# Patient Record
Sex: Male | Born: 1945 | Race: White | Hispanic: No | Marital: Married | Smoking: Light tobacco smoker
Health system: Southern US, Community
[De-identification: ages and names within clinical notes are randomized; demographics above are authoritative.]

## PROBLEM LIST (undated history)

## (undated) DIAGNOSIS — Z85820 Personal history of malignant melanoma of skin: Secondary | ICD-10-CM

## (undated) DIAGNOSIS — Z87442 Personal history of urinary calculi: Secondary | ICD-10-CM

## (undated) DIAGNOSIS — R319 Hematuria, unspecified: Secondary | ICD-10-CM

## (undated) DIAGNOSIS — I152 Hypertension secondary to endocrine disorders: Secondary | ICD-10-CM

## (undated) DIAGNOSIS — Z972 Presence of dental prosthetic device (complete) (partial): Secondary | ICD-10-CM

## (undated) DIAGNOSIS — E1122 Type 2 diabetes mellitus with diabetic chronic kidney disease: Secondary | ICD-10-CM

## (undated) DIAGNOSIS — R399 Unspecified symptoms and signs involving the genitourinary system: Secondary | ICD-10-CM

## (undated) DIAGNOSIS — M1990 Unspecified osteoarthritis, unspecified site: Secondary | ICD-10-CM

## (undated) DIAGNOSIS — J309 Allergic rhinitis, unspecified: Secondary | ICD-10-CM

## (undated) DIAGNOSIS — N329 Bladder disorder, unspecified: Secondary | ICD-10-CM

## (undated) DIAGNOSIS — N183 Chronic kidney disease, stage 3 unspecified: Secondary | ICD-10-CM

## (undated) DIAGNOSIS — Z8669 Personal history of other diseases of the nervous system and sense organs: Secondary | ICD-10-CM

## (undated) DIAGNOSIS — E785 Hyperlipidemia, unspecified: Secondary | ICD-10-CM

## (undated) DIAGNOSIS — Z9989 Dependence on other enabling machines and devices: Secondary | ICD-10-CM

## (undated) DIAGNOSIS — N210 Calculus in bladder: Secondary | ICD-10-CM

## (undated) DIAGNOSIS — N400 Enlarged prostate without lower urinary tract symptoms: Secondary | ICD-10-CM

## (undated) DIAGNOSIS — E119 Type 2 diabetes mellitus without complications: Secondary | ICD-10-CM

## (undated) DIAGNOSIS — G4733 Obstructive sleep apnea (adult) (pediatric): Secondary | ICD-10-CM

## (undated) HISTORY — PX: TOTAL KNEE ARTHROPLASTY: SHX125

## (undated) HISTORY — PX: MELANOMA EXCISION: SHX5266

---

## 1970-03-03 HISTORY — PX: GANGLION CYST EXCISION: SHX1691

## 2004-11-20 ENCOUNTER — Emergency Department (HOSPITAL_COMMUNITY): Admission: EM | Admit: 2004-11-20 | Discharge: 2004-11-20 | Payer: Self-pay | Admitting: Emergency Medicine

## 2005-12-15 ENCOUNTER — Inpatient Hospital Stay (HOSPITAL_COMMUNITY): Admission: RE | Admit: 2005-12-15 | Discharge: 2005-12-18 | Payer: Self-pay | Admitting: Orthopedic Surgery

## 2006-12-14 ENCOUNTER — Inpatient Hospital Stay (HOSPITAL_COMMUNITY): Admission: RE | Admit: 2006-12-14 | Discharge: 2006-12-16 | Payer: Self-pay | Admitting: Orthopedic Surgery

## 2007-10-27 IMAGING — CR DG CHEST 2V
2 series · 2 of 2 positions shown · non-contrast
Comparison: None

CLINICAL DATA: 60-year-old male, history of sleep apnea, pre-op evaluation for left knee surgery.     
 CHEST - 2 VIEW:

[view not recorded (1 of 2)]
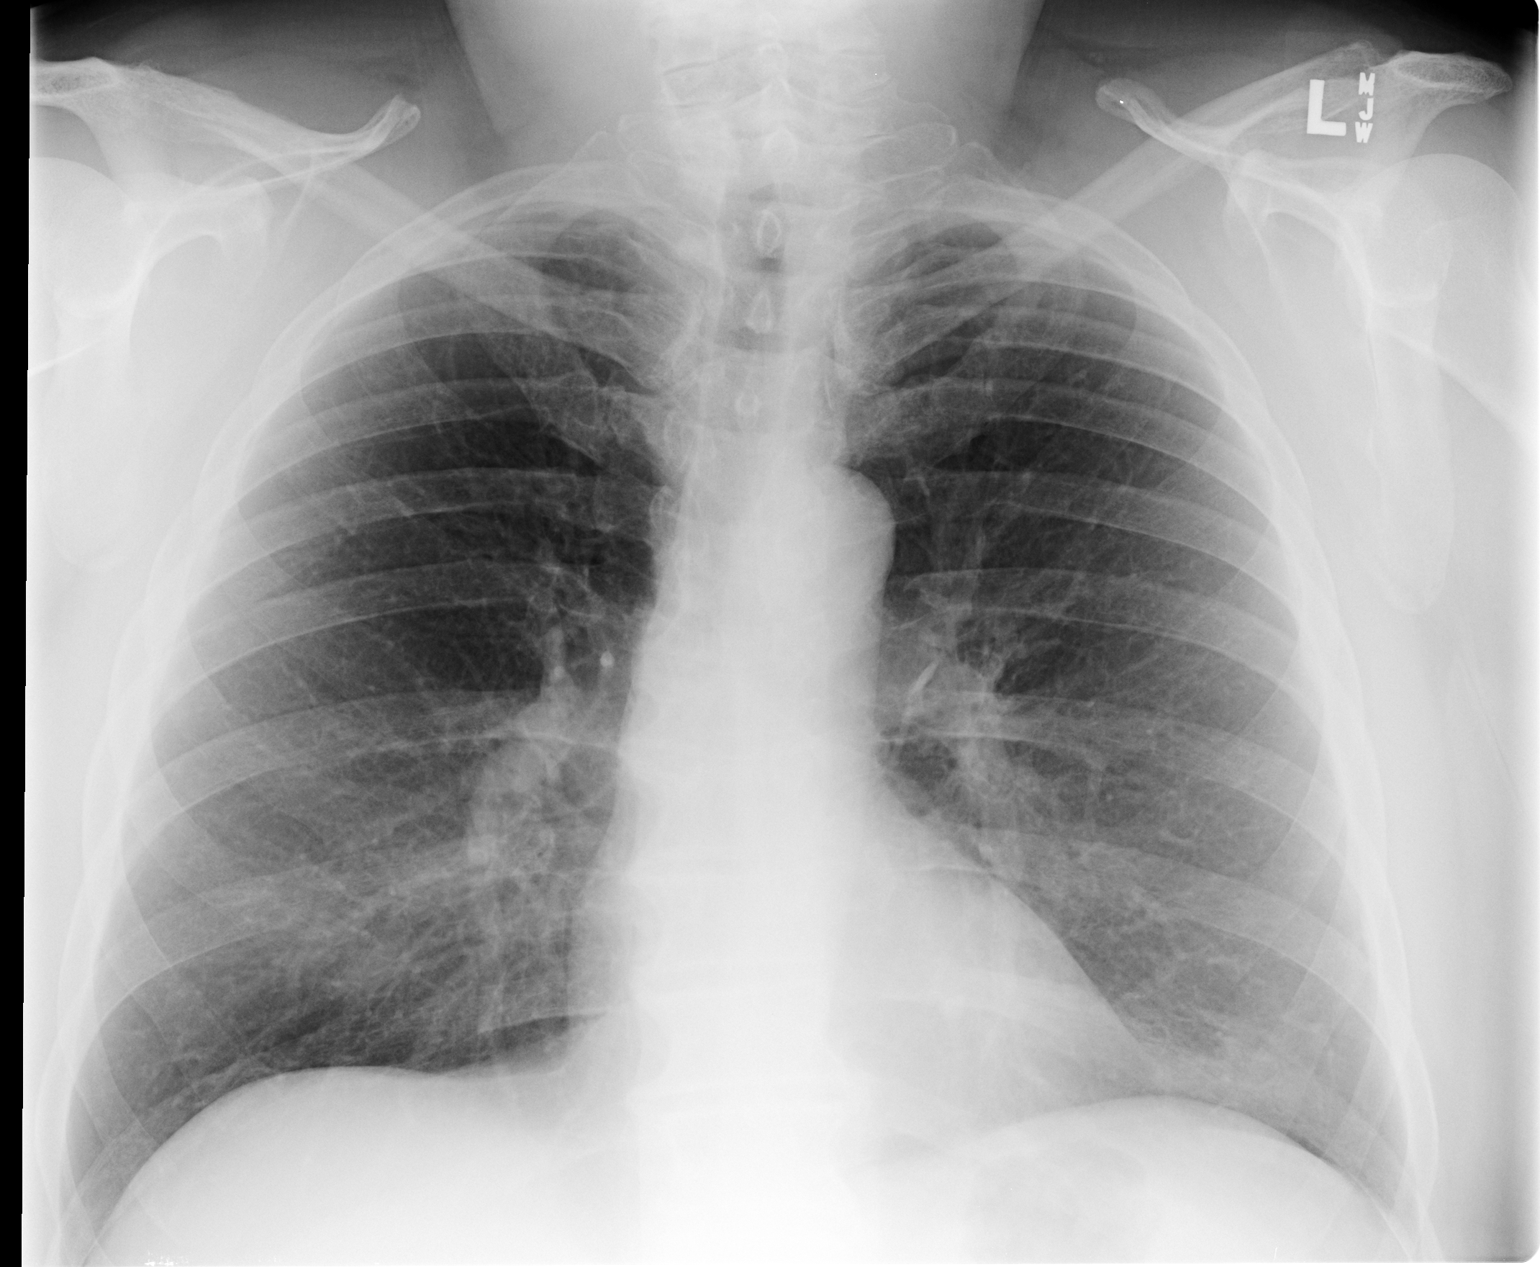

[view not recorded (2 of 2)]
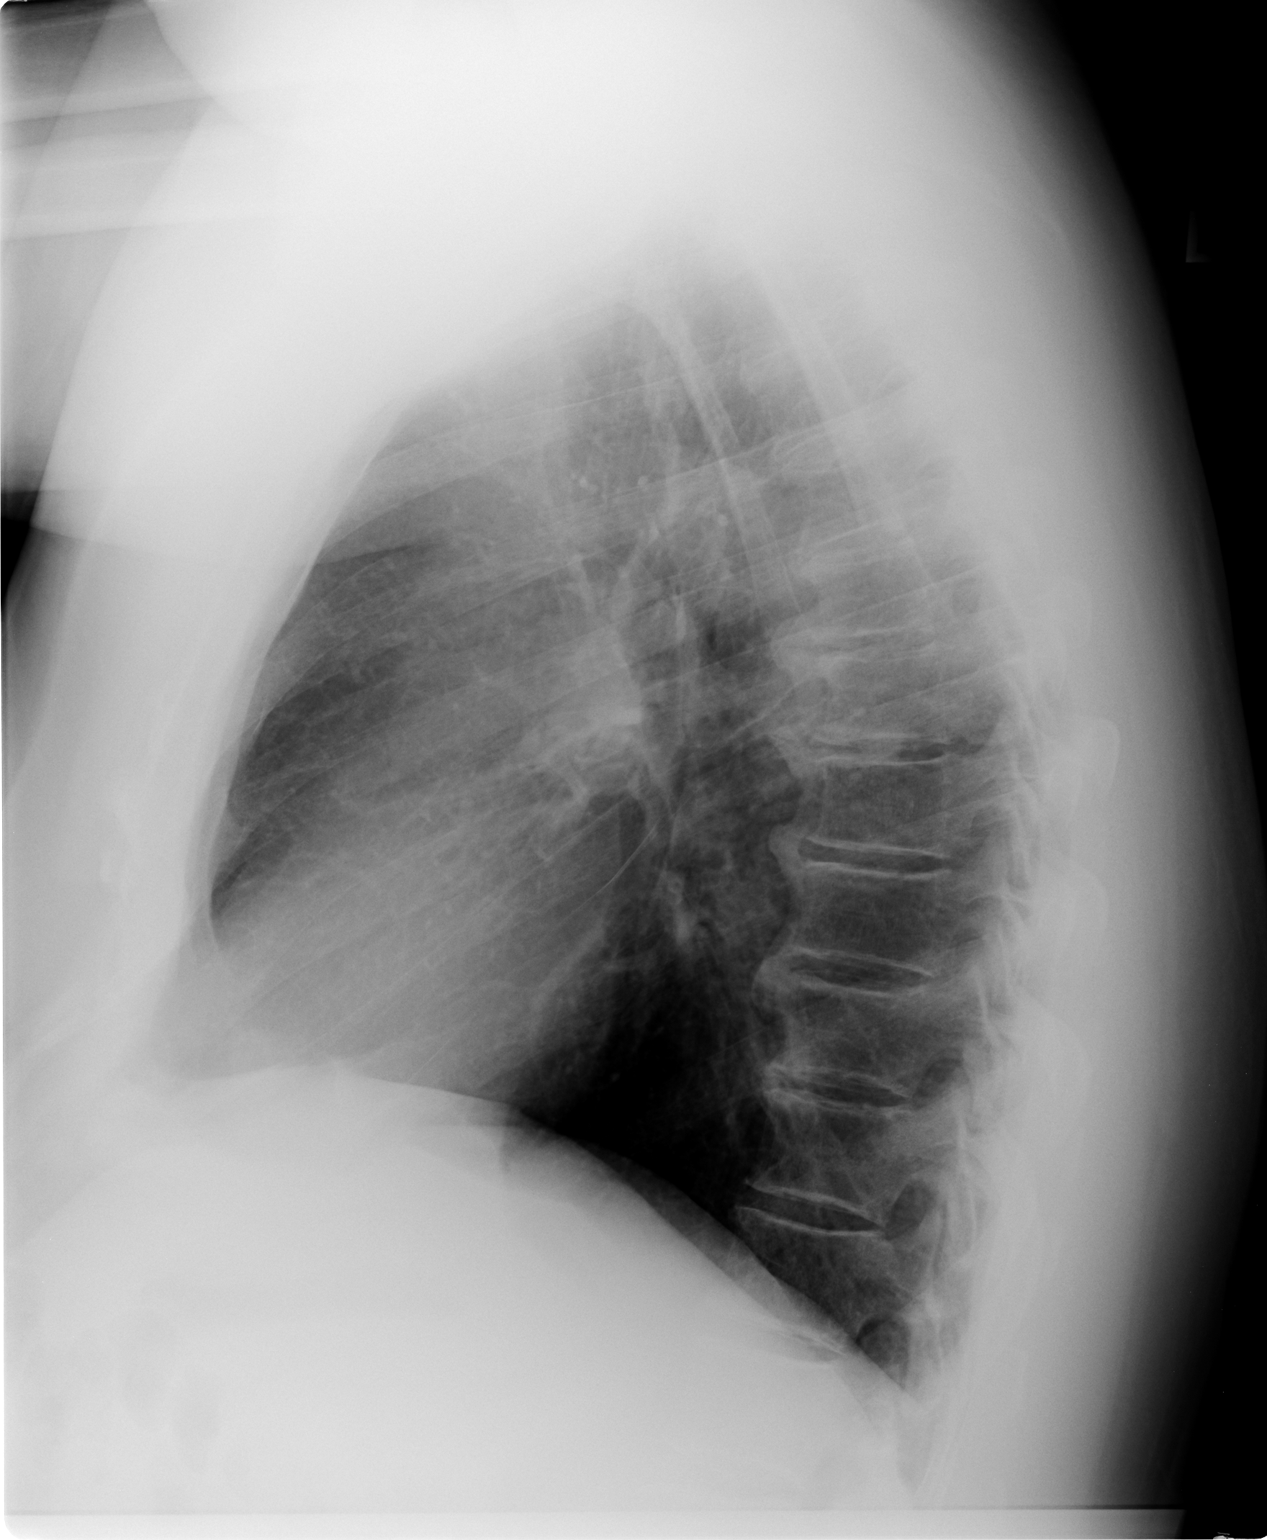

[2 of 2 positions shown; findings below may reference images not displayed]

FINDINGS: Minimal central airway thickening and interstitial prominence without acute pneumonia, edema, effusion, or pneumothorax.  Normal heart size.  Degenerative change of the spine.
IMPRESSION: Minimal central airway thickening and interstitial prominence.  No acute chest process.

## 2008-10-25 IMAGING — CR DG CHEST 2V
2 series · 2 of 2 positions shown · non-contrast
Comparison: none

CLINICAL DATA: Preoperative respiratory exam for knee replacement.  Hypertension.
 CHEST - 2 VIEWS:

[view not recorded (1 of 2)]
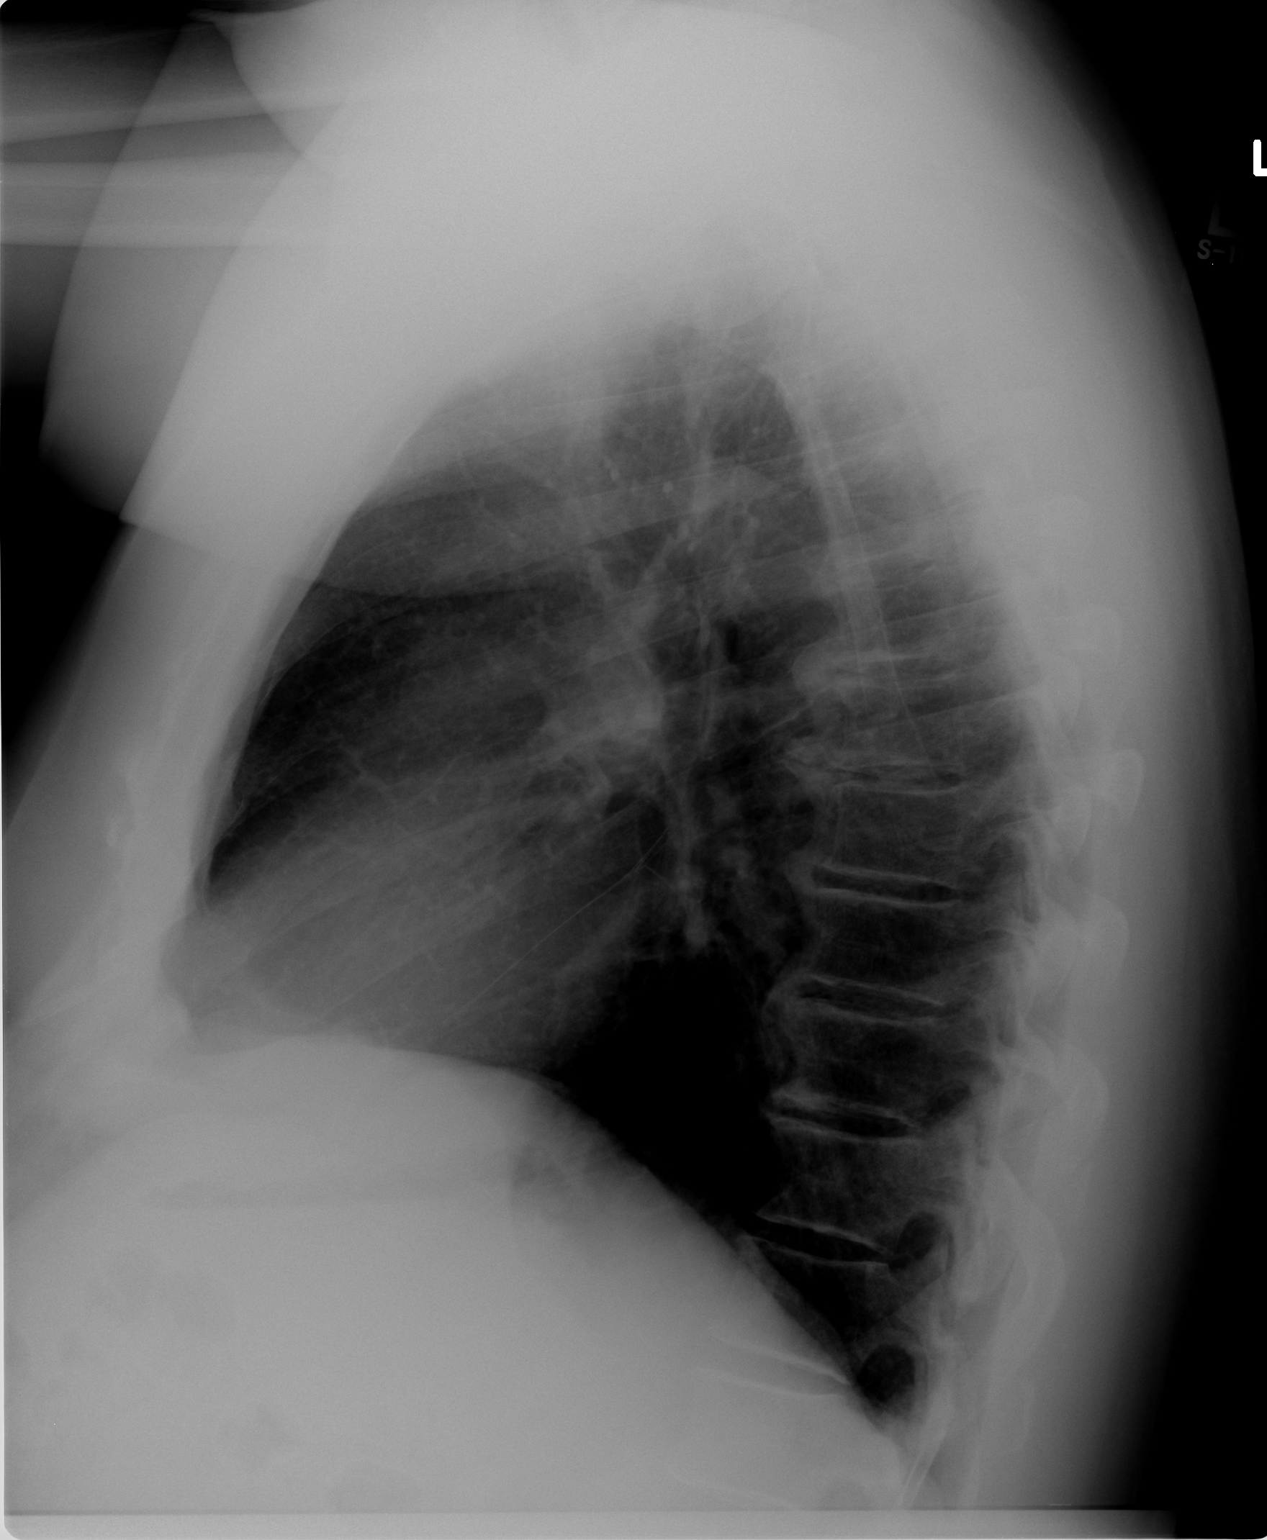

[view not recorded (2 of 2)]
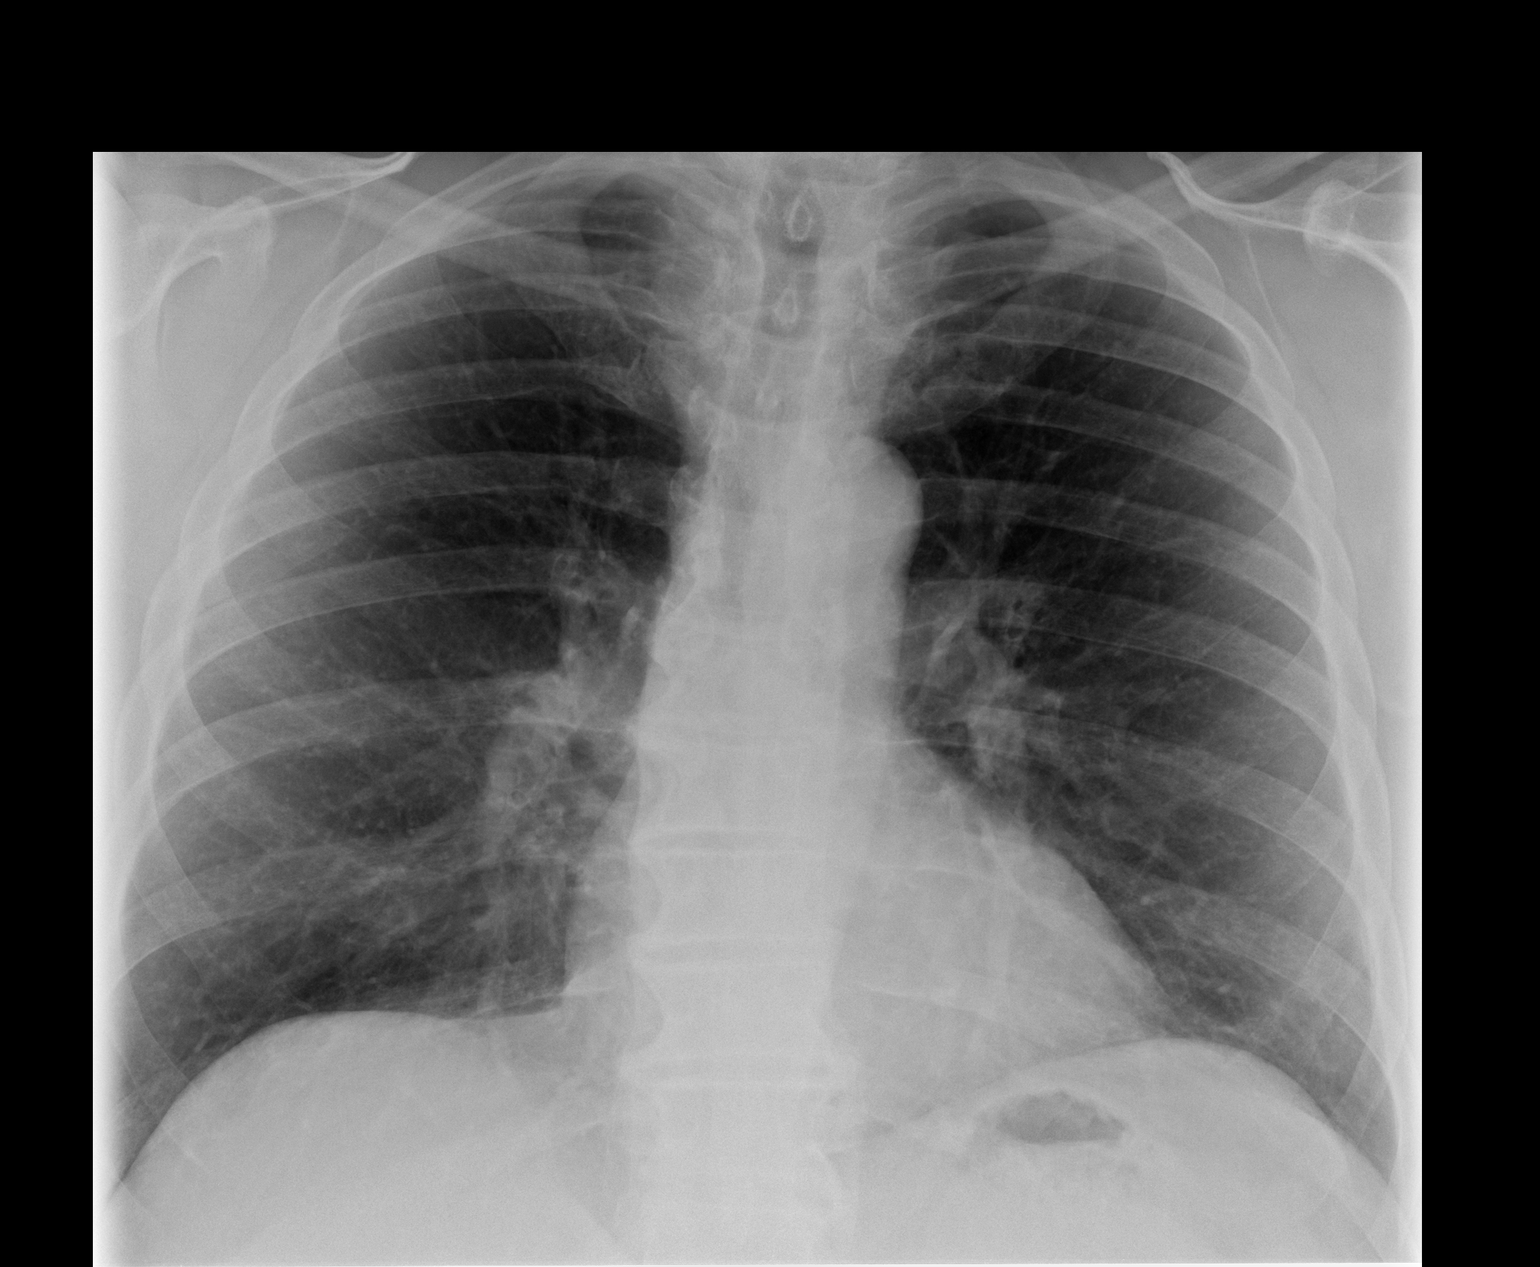

[2 of 2 positions shown; findings below may reference images not displayed]

FINDINGS: The heart size and mediastinal contours are within normal limits.  Both lungs are clear.  The visualized skeletal structures are within normal limits.
IMPRESSION: No active cardiopulmonary disease.

## 2010-07-16 NOTE — Op Note (Signed)
NAMETAGG, EUSTICE NO.:  [id number]   MEDICAL RECORD NO.:  [id number]          PATIENT TYPE:  INP   LOCATION:  2899                         FACILITY:  [facility]   PHYSICIAN:  Feliberto Gottron. Turner Daniels, M.D.   DATE OF BIRTH:  [birth year]   DATE OF PROCEDURE:  12/14/2006  DATE OF DISCHARGE:                               OPERATIVE REPORT   PREOPERATIVE DIAGNOSIS:  Osteoarthritis of right knee.   POSTOPERATIVE DIAGNOSIS:  Osteoarthritis of right knee.   PROCEDURE:  Right total knee arthroplasty using DePuy Sigma RP  components, #5 femur, #5 tibia, 10-mm Sigma RP bearing, 41 mm patellar  button.  All components cemented DePuy I cement with 1500 mg Zinacef.   SURGEON:  Feliberto Gottron. Turner Daniels, M.D.   FIRST ASSISTANT:  Erskine Squibb B. Su Hilt, P.A.-C.   ANESTHESIA:  General endotracheal anesthesia.   ESTIMATED BLOOD LOSS:  Minimal.   FLUID REPLACEMENT:  59 mL crystalloid.   DRAINS PLACED:  Two medium hemostats.   TOURNIQUET TIME:  One hour and 20 minutes.   INDICATIONS FOR PROCEDURE:  A 65 year old man who underwent a successful  left total knee a few months ago and now desires same on the right, end-  stage arthritis with a 10 degrees flexion contracture, 7 degrees of  varus deformity, bone-on-bone arthritic changes by x-rays and he has  failed conservative treatment.  Risks and benefits of surgery are well-  known to the patient and questions have been answered.   DESCRIPTION OF PROCEDURE:  The patient was identified by armband and  underwent femoral nerve block anesthesia in the block area at [facility] with anesthetic monitors attached.  He was then  taken to operating room 15 where the appropriate anesthetic monitors  were reattached.  He received 2 grams of Ancef preoperatively and  general endotracheal anesthesia was induced.  A lateral post and foot  positioner were applied to the right side of the table.  Tourniquet  applied high to the right thigh and the  right lower extremity prepped  and draped in usual sterile fashion from the ankle to the tourniquet.  We began the procedure by wrapping the limb with an Esmarch bandage and  inflating the tourniquet to 350 mmHg and an anterior midline incision 20  cm in length was made from a handbreadth above the patella, over the  patella just medial 2 and 3 cm distal to the tibial tubercle.  Small  bleeders in the skin and subcutaneous tissue were identified and  cauterized.  The transverse retinaculum over the patella was incised and  reflected medially allowing a medial parapatellar arthrotomy, leaving a  small cuff of tendon on the medial side of the arthrotomy.  The patella  was everted, prepatellar fat pad was resected, the superficial medial  collateral ligament was elevated proximally on the tibia from anterior  to posterior leaving intact distally and stripping it off the flare of  the metaphysis.  This loosened up the medial side which was quite  contracted.  At this point, the patella was everted and the knee  hyperflexed.  Using osteotomes, peripheral notch osteophytes were  removed allowing resection of the ACL and PCL.  The superficial medial  collateral ligament was elevated all the way around to the posteromedial  corner.  We did not have to take down the semimembranosus.  The tibia  was then externally rotated with the knee hyperflexed and a  posteromedial Z retractor was placed.  A McHale retractor through the  notch and a lateral Homan retractor.  This exposed the proximal tibia  which was then entered in the center with the DePuy step drill, followed  by intramedullary rod, followed by a 0 degrees posterior slope cutting  guide.  This allowed removal of about 3-4 mm of bone medially, 8-9 mm of  bone laterally, with the cutting guide pinned into place using the power  saw.  We then entered the distal femur with the step drill 2 mm anterior  to the PCL origin, followed by a 5 degree  right distal femoral cutting  guide rod set at 12 mm and then pinned in place along the epicondylar  axis.  The distal femoral cut was accomplished.  We measured for a #5  right femoral component and the pins were drilled in 3 degrees of  external rotation.  The chamfer cutting guide was then brought up and  placed.  We performed the anterior, posterior and chamfer cuts without  difficulty, removed some posterior osteophytes from the superior femur  and then performed our box cut as well with a box cutting guide.  The  patella was then measured a 26 mm, the cutting guide set at 15 and the  posterior 11 mm of the patella resected, sized for a 41 button and  drilled.  The knee was once again hyperflexed.  We sized the tibia to a  #5 tibial baseplate which was pinned into place, smokestack was hammered  into place, the conical reamer applied, followed by the Deltafit keel  punch and then a 5 right trial femoral component was hammered onto the  femur and the lugs drilled.  At this point we selected a #5 10-mm sigma  RP bearing which was slipped into place.  The knee came to full  extension and flexed to 130 degrees.  The patellar trial tracked  normally and our sizes were now set.  At this point the trial components  were removed.  All bony surfaces were waterpiked clean and dried with  suction and sponges.  A double patch of DePuy 1 cement with 1500 mg  Zinacef was mixed at the back table and applied to all bony and metallic  mating surfaces except for the posterior condyles of the femur itself.  At this point, we hammered into place a #5 tibial baseplate and removed  excess cement.  The #5 right femoral component, removed excess cement  and clamped a 41 mm patellar button into place and removed excess cement  with Personal assistant.  The 10-mm sigma RP bearing was then snapped into  place, the knee reduced, brought to full extension and held in 5 degrees  of flexion with compression as the  cement hardened.  The wound was then  irrigated out with normal saline solution one more time.  Medium Hemovac  drains were placed deep in the wound as the cement hardened.  Once it  had hardened, we checked our motion one more time and ligament stability  was found to be excellent.  At this point, the parapatellar arthrotomy  was closed  with running #1 Vicryl suture.  The subcutaneous tissue 0 and  2-0 undyed Vicryl suture and the skin with skin staples.  A dressing of  Xeroform, 4x4 dressing sponges, Webril and Ace wrap was then applied.  The tourniquet was let down.  The patient was then awakened and taken to  the recovery room without difficulty.      Feliberto Gottron. Turner Daniels, M.D.  Electronically Signed     FJR/MEDQ  D:  12/14/2006  T:  12/14/2006  Job:  [id number]

## 2010-07-19 NOTE — Discharge Summary (Signed)
NAME:  Kenneth Chen, Kenneth Chen NO.:  [id number]   MEDICAL RECORD NO.:  [id number]          PATIENT TYPE:  INP   LOCATION:  5018                         FACILITY:  [facility]   PHYSICIAN:  Feliberto Gottron. Turner Daniels, M.D.   DATE OF BIRTH:  1946/03/01   DATE OF ADMISSION:  12/15/2005  DATE OF DISCHARGE:  12/18/2005                                 DISCHARGE SUMMARY   PRIMARY DIAGNOSIS FOR THIS ADMISSION:  End-stage degenerative joint disease  of the left knee.   PROCEDURE WHILE IN HOSPITAL:  Left total knee arthroplasty.   HISTORY OF PRESENT ILLNESS:  The patient is a 65 year old male with end-  stage arthritis of the left knee.  He has failed conservative treatment,  including physical therapy, __________injection, steroid injections,  nonsteroidal antiinflammatories, rest and physical therapy.  The pain now  interferes with sleep, walking and other activities of daily living.  He  wishes to proceed with a left total knee arthroplasty after discussing the  risks versus benefits of the procedure.   ALLERGIES:  NO KNOWN DRUG ALLERGIES.   MEDICATIONS AT TIME TO ADMISSION:  Were Allegra, Rhinocort and aspirin.   PAST MEDICAL HISTORY:  Usual childhood diseases.  Adult history:  1. DJD.  2. Kidney stones.  3. Sleep apnea.   SURGICAL HISTORY:  Ganglion cyst removal in 1972.   SOCIAL HISTORY:  Positive for tobacco occasionally.  No ethanol.  No IV drug  abuse.  He is a life Nutritional therapist.  He is married and lives at home  with wife.   FAMILY HISTORY:  Mother is deceased with history of cancer and diabetes.  Father is deceased with a history of kidney disease and hypertension.   REVIEW OF SYSTEMS:  Positive for glasses wearer, hemorrhoids, difficulty  sleeping and upper dentures.  He denies shortness of breath, chest pain or  recent illness.   PHYSICAL EXAMINATION:  VITAL SIGNS:  Patient's temperature is 99.1.  Pulse  90.  Blood pressure 137/98.  He is 5 feet 11 inches, 145 pound  male.  HEENT:  Head is normocephalic, atraumatic.  Ears:  TMs are clear.  Eyes:  Pupils equal, round and reactive to light and accommodation.  Nose:  Patent.  Throat:  Benign.  NECK:  Supple.  Full range of motion.  CHEST:  Clear to auscultation and percussion.  HEART:  Regular rate and rhythm.  ABDOMEN:  Soft, nontender.  Bowel sounds 2+.  EXTREMITIES:  Range of motion left knee 5 to 100 degrees, positive pain and  crepitus with 5 to 10 degree of various deformity.  SKIN:  Within normal limits.   X-ray shows bone on bone change in medial compartment of both knees with  positive patella femoral changes as well.   LABS:  Preoperative labs including CBC, CMET, chest x-ray, EKG, PT and PTT  were all within normal limits with the exception of the EKG, which just  showed nonspecific T-wave abnormality, as well as a hemoglobin of 17.3 and a  glucose of 132.   HOSPITAL COURSE:  On the day of admission, the patient  was taken to the  operating room at [facility] where he underwent a left total knee  arthroplasty using DePew sigma components with a number 5 femur, number 5  tibia, 10 spacer with a 41 mm patella, all components cemented.  Medium  Hemovac ___________was placed in the drain.  Foley catheter was placed  perioperatively.  The patient was placed on preoperative antibiotics.  He  was placed on postoperative Coumadin prophylaxis per pharmacy protocol with  a target INR of 1.52 with Lovenox coverage.  He was placed on a PCA Dilaudid  pain pump for pain control.  He was placed on a CPAP protocol by respiratory  therapy for his sleep apnea postoperatively.  Postop physical therapy was  begun in the recovery room with CPM machine.   Postoperative day 1, the patient was awake, alert and in moderate pain.  No  nausea or vomiting and bowel sounds are stable.  Dressing was dry.  Hemoglobin 12.9, WBC of 8.9, INR of 1.1.  T-Max 100.0.  Urine output 1000;  otherwise, neurovascularly intact.   Foley was discontinued.  He continued  with physical therapy weight bearing as tolerated, as well as his Coumadin  treatment.   Postoperative day 2, the patient was without complaints.  T-Max 101.8,  ranging to 98.6.  INR 1.5.  WBC of 10.5, 12.2 hemoglobin.  Dressing was dry.  Calf was soft.  He continued with physical therapy and thus making steady  progress and had already undergone discharge planning to facilitate his  discharge as he progressed.   Postoperative day 3, the patient was awake and alert in decreased pain.  Vital signs were stable.  T-Max 101 ranging to afebrile at the time of  discharge.  WBC of 8.8, hemoglobin 12.  INR greater than 1.6.  Wound was  clean and dry.  No effusion.  CPM at 55 degrees.  Wound was benign.  Calf  was soft.  He was medically stable and orthopedically improved.  He was  discharged home to the care of his family.  He will have home health RN for  Coumadin draws through [facility] for 2 weeks postoperatively.  Return to clinic to see Dr. Turner Daniels in approximately one week's time, sooner  if he is having difficulties with increased temperature, increased pain or  drainage from the wound.   DISCHARGE MEDICATIONS:  Include Allegra and Rhinocort started as per his  home medication reconciliation sheet.  He is given prescriptions for  Coumadin and Percocet.   Dressing changes daily or as needed if the dressings become wet.  Okay to  shower.   ACTIVITY:  Weight bearing as tolerated with crutches or walker.  Continue  with CPM.   DIET:  Regular.      Laural Benes. Jannet Mantis.      Feliberto Gottron. Turner Daniels, M.D.  Electronically Signed    JBR/MEDQ  D:  01/07/2006  T:  01/08/2006  Job:  [id number]

## 2010-07-19 NOTE — Op Note (Signed)
NAME:  Kenneth Chen, Kenneth Chen NO.:  [id number]   MEDICAL RECORD NO.:  [id number]          PATIENT TYPE:  INP   LOCATION:  NA                           FACILITY:  [facility]   PHYSICIAN:  Feliberto Gottron. Turner Daniels, M.D.   DATE OF BIRTH:  05-04-1945   DATE OF PROCEDURE:  12/15/2005  DATE OF DISCHARGE:                                 OPERATIVE REPORT   PREOPERATIVE DIAGNOSIS:  End-stage arthritis, left knee.   POSTOPERATIVE DIAGNOSIS:  End-stage arthritis, left knee.   PROCEDURE:  Left total knee arthroplasty using DePuy Sigma RP components, 5  femur, 5 tibia, 41 mm patella, 10 Sigma RP bearing, double batch of DePuy  regular set cement with 1500 mg of Zinacef in the cement.   SURGEON:  Feliberto Gottron. Turner Daniels, M.D.   FIRST ASSISTANT:  Skip Mayer PA-C.   ANESTHETIC:  General endotracheal.   ESTIMATED BLOOD LOSS:  Minimal.   FLUID REPLACEMENT:  1200 mL crystalloid.   DRAINS PLACED:  Two medium Hemovacs and a Foley catheter.   URINE OUTPUT:  300 mL.   TOURNIQUET TIME:  1 hour and 25 minutes.   INDICATIONS FOR PROCEDURE:  A 65 year old gentleman with end-stage arthritis  of both knees has failed conservative treatment with anti-inflammatory  medicines, physical therapy, exercise, visco-  supplementation, and  judicious use of narcotics.  He has bone-on-bone arthritic changes to the  medial compartments of both knees and desires elective left total knee  arthroplasty.  Risks and benefits of surgery discussed and questions  answered.   DESCRIPTION OF PROCEDURE:  The patient identified by armband, taken to the  operating room at [facility].  Appropriate anesthetic monitors were  attached and general endotracheal anesthesia induced with the patient in  supine position.  Foley catheter was inserted.  He received 1 g of Ancef  preoperatively.  Lateral post and foot positioner applied to the table and a  tourniquet applied high to the left thigh.  Left lower extremity prepped and  draped in usual sterile fashion from the ankle to the tourniquet.  The limb  was wrapped with an Esmarch bandage, tourniquet inflated to 350 mmHg and  began the procedure by making anterior midline incision about 18 cm in  length, centered over the patella, starting 1 handbreadth above the patella  and extending over the patella down just distal to and medial to the tibial  tubercle, through the skin and subcutaneous tissue down to the level of the  transverse retinaculum which was incised and reflected medially allowing a  medial parapatellar arthrotomy.  The patella was then everted, the  prepatellar fat pad resected.  The superficial medial collateral ligament  was elevated off the proximal tibia leaving it intact distally and going all  the way around the semimembranosus origin.  The patella was everted; the  knee was flexed to about 130 degrees.  Osteophytes were removed from around  the femur/at the notch area, and then a posterior medial Z retractor was  placed followed by a McHale retractor through the notch and a lateral Homan  retractor.  The tibial spines were then removed with an osteotome and the  proximal tibia entered with the DePuy step-drill followed by the  intramedullary rod and a 0 degree posterior slope cutting guide.  We then  performed our proximal tibial resection after pinning the cutting guide in  place, allowing removal of about 7-8 mm of bone medially and 10 mm of bone  laterally.  Satisfied with our tibial cut, we then directed our attention to  the distal femur which was entered with the step-drill followed by the  intramedullary rod and a 5-degree left distal femoral cutting guide.  This  was set at 12 mm.  It was a relatively large size of his femur, and the  cutting guide was pinned into place along the epicondylar axis and the  distal femoral cut accomplished.  We sized for a 5 femoral component, pinned  at 3 degrees of external rotation on the femur and  performed our anterior,  posterior, and chamfer cuts without difficulty followed by the Sigma RP box  cut.  The everted patella was measured at 25 mm.  Because it was large, we  anticipated a 41 mm patellar button.  We set the cutting guide for 15 and  resected the posterior 10 mm of the patella.  It was sized for a 41 button  and drilled, and some more osteophytes were removed.  The knee was then once  again hyperflexed, exposing the proximal tibia which was sized to a #5  baseplate which was pinned into place, the smokestack applied, and the  conical reaming accomplished.  The Delta fit keel was then hammered into  place with the centering bullet, and a trial reduction was performed with a  5 left trial femur, a 10-mm Sigma RP spacer on top of the tibial baseplate  and a 41 mm patellar button.  Excellent tracking was noted with good  stability.  The trial components were removed.  All bony surfaces were water-  picked clean and dried with suction and sponges and at the back table, the  components were then opened, and a double batch of DePuy standard viscosity  cement was then mixed with 1500 mg of Zinacef.  It was applied to all bony  and metallic mating surfaces except for the posterior condyles of the femur  itself.  In order, we then hammered into place a #5 tibial baseplate and  removed excess cement; a 5 left femoral component removed, excess cement.  We snapped in a 10-mm sigma RP bearing, reduced the knee, compressed the 41  mm patellar button, and removed excess cement and placed 2 medium Hemovacs  into the wound from a superolateral approach.  After the cement had cured,  the knee was irrigated out with normal saline solution.  We checked our  tracking 1 more time, and the knee came to full extension and flexed easily  to 130 degrees.  At this point, we closed with running #1 Vicryl suture in  the parapatellar arthrotomy, undyed 0 and 2-0 Vicryl suture in the subcutaneous tissue  and skin staples, a dressing of Xeroform 4x4 dressing  sponges, Webril, and Ace wrap was applied.  The tourniquet was let down.  The patient was then awakened and taken to the recovery room without  difficulty.      Feliberto Gottron. Turner Daniels, M.D.  Electronically Signed     FJR/MEDQ  D:  12/15/2005  T:  12/15/2005  Job:  [id number]

## 2010-07-19 NOTE — Discharge Summary (Signed)
NAME:  Kenneth, Chen NO.:  [id number]   MEDICAL RECORD NO.:  [id number]          PATIENT TYPE:  INP   LOCATION:  5013                         FACILITY:  [facility]   PHYSICIAN:  Feliberto Gottron. Turner Daniels, M.D.   DATE OF BIRTH:  March 22, 1945   DATE OF ADMISSION:  12/14/2006  DATE OF DISCHARGE:  12/16/2006                               DISCHARGE SUMMARY   DIAGNOSIS:  End-stage degenerative joint disease of the right knee.   PROCEDURE WHILE IN THE HOSPITAL:  Right total knee arthroplasty.   DISCHARGE SUMMARY:  The patient is a 65 year old man who underwent left  total knee arthroplasty a few months prior to this admission.  He now  desires the same for the right where he has known end-stage arthritis  with a 10-degree flexure contraction, 7-degrees of varus deformity bone  on bone arthritic changes by x-ray.  He has failed conservative  treatment.  Risks and benefits of surgery well known to the patient.  Questions have been answered, he now wishes to proceed with the  procedure.   THE PATIENT HAS NO KNOWN DRUG ALLERGIES.   MEDICATIONS AT THE TIME OF ADMISSION:  Allegra and aspirin.   PAST MEDICAL HISTORY:  Usual childhood diseases.  Adult history:  1. DJD.  2. Kidney stones.  3. Sleep apnea.   PAST SURGICAL HISTORY:  1. Ganglion cyst.  2. Left total knee arthroplasty.   SOCIAL HISTORY:  Occasional tobacco.  No ethanol.  No IV drug abuse.  He  is a life Nutritional therapist.  He is married.   FAMILY HISTORY:  Mother died with history of cancer and diabetes.  Father died with history of kidney disease and hypertension.   REVIEW OF SYSTEMS:  Positive for glasses, hemorrhoids, difficulty  sleeping, upper dentures.  He denies any shortness of breath, chest  pain, or recent illness.   EXAMINATION:  VITAL SIGNS:  Temperature 98.2, pulse 87, blood pressure  148/96.  He is a 5 foot 11 inch, 245 pound male.  HEAD:  Normocephalic, atraumatic.  EARS:  TMs clear.  EYES:  Pupils  equally round and reactive to light and accommodation.  THROAT:  Benign.  NECK:  Supple.  Full range of motion.  CHEST:  Clear to auscultation and percussion.  HEART:  Regular rate and rhythm.  ABDOMEN:  Soft, nontender.  Bowel sounds 2+.  No masses.  EXTREMITIES:  Range of motion of the left total knee is 0 to 120 degrees  and stable.  Range of motion of the right knee shows range of motion 5  to 110 degrees with positive crepitus and pain, 5 degree varus  deformity, stable ligamentously.  SKIN:  Well-healed normal scar on the left knee.  No other breaks in  skin noted.   X-ray showed bone on bone changes in the medial compartment of the right  knee.   Preoperative labs including CBC, C-Met, chest x-ray, EKG, PT, and PTT  were all within normal limits with exception of a glucose of 165.   HOSPITAL COURSE:  On the day of admission, the patient was  taken to the  operating room at [facility] where he underwent a right total knee  arthroplasty using DePuy Sigma RP components, #5 femur, #5 tibia, 10-mm  Sigma RP bearing, 41-mm patellar button.  All components cemented with  DePuy 1 cement with 1500 mg of Ancef embedded.  A medium HemoVac was  placed in the knee double-armed.  The patient was placed on  perioperative antibiotics.  He was placed on postoperative Coumadin  prophylaxis with target INR of 1.5 to 2 with bridging Lovenox therapy  until he became therapeutic.  He was placed on a PCA Dilaudid pump for  pain control.  Physical therapy was begun in the PACU using a CPM.   Postoperative day 1, the patient was awake and alert.  He reports some  nausea and vomiting the night before, none since.  T-max 100.  INR 1.0.  Hemoglobin 13.6, WBC 10.6.  Dressing was dry.  HemoVac was discontinued.  PCA was discontinued.  Physical therapy was begun, weightbearing as  tolerated.   Postoperative day 2, the patient reported notable improvement.  He has  walked in the hallway, taken p.o.'s  well, had a bowel movement.  The  wound was clean and dry.  T-max 100.4 ranging 99.  Hemoglobin 12.1, WBC  11.  INR 1.7.  He was neurovascularly intact.  He was both medically  stable and orthopedically improved.  He was discharged home to the care  of his family as he mends.  PT goals of independent ambulation with  safety precautions.   Return to the clinic in approximately 1 week time for follow up check.   Prescriptions for Percocet and Coumadin were given.  He will be followed  by home health for the Coumadin, as well as home health PT and RN as  necessary, with advanced home care.   Dressing changes every day or every other day as needed.  May shower  postoperative day 5, seated.  Diet is regular.  Resume home meds as per  home medication reconciliation sheet.  Activity is weightbearing as  tolerated with walker.  Home health CPM as well.      Laural Benes. Jannet Mantis.      Feliberto Gottron. Turner Daniels, M.D.  Electronically Signed   JBR/MEDQ  D:  01/26/2007  T:  01/26/2007  Job:  [id number]

## 2010-09-02 ENCOUNTER — Encounter (INDEPENDENT_AMBULATORY_CARE_PROVIDER_SITE_OTHER): Payer: Self-pay | Admitting: General Surgery

## 2010-12-11 LAB — CBC
HCT: 35.1 — ABNORMAL LOW
Hemoglobin: 12.1 — ABNORMAL LOW
MCHC: 34.5
RBC: 3.99 — ABNORMAL LOW

## 2010-12-11 LAB — PROTIME-INR: INR: 1.7 — ABNORMAL HIGH

## 2010-12-12 LAB — CBC
HCT: 39.4
HCT: 47.9
Hemoglobin: 13.6
Hemoglobin: 16.3
MCHC: 34.5
RDW: 13.7
RDW: 13.7

## 2010-12-12 LAB — PROTIME-INR
INR: 1
Prothrombin Time: 13

## 2010-12-12 LAB — DIFFERENTIAL
Basophils Absolute: 0
Basophils Relative: 0
Neutro Abs: 4.3
Neutrophils Relative %: 56

## 2010-12-12 LAB — COMPREHENSIVE METABOLIC PANEL
Alkaline Phosphatase: 81
BUN: 20
Chloride: 106
Glucose, Bld: 165 — ABNORMAL HIGH
Potassium: 4.4
Total Bilirubin: 0.9
Total Protein: 6.9

## 2010-12-12 LAB — URINALYSIS, ROUTINE W REFLEX MICROSCOPIC
Bilirubin Urine: NEGATIVE
Glucose, UA: NEGATIVE
Ketones, ur: NEGATIVE
pH: 7

## 2010-12-12 LAB — APTT: aPTT: 31

## 2011-11-30 DIAGNOSIS — Z23 Encounter for immunization: Secondary | ICD-10-CM | POA: Diagnosis not present

## 2011-12-17 DIAGNOSIS — D239 Other benign neoplasm of skin, unspecified: Secondary | ICD-10-CM | POA: Diagnosis not present

## 2011-12-17 DIAGNOSIS — L821 Other seborrheic keratosis: Secondary | ICD-10-CM | POA: Diagnosis not present

## 2012-03-15 DIAGNOSIS — Z125 Encounter for screening for malignant neoplasm of prostate: Secondary | ICD-10-CM | POA: Diagnosis not present

## 2012-03-15 DIAGNOSIS — Z0000 Encounter for general adult medical examination without abnormal findings: Secondary | ICD-10-CM | POA: Diagnosis not present

## 2012-03-15 DIAGNOSIS — R030 Elevated blood-pressure reading, without diagnosis of hypertension: Secondary | ICD-10-CM | POA: Diagnosis not present

## 2012-03-15 DIAGNOSIS — IMO0001 Reserved for inherently not codable concepts without codable children: Secondary | ICD-10-CM | POA: Diagnosis not present

## 2012-04-01 DIAGNOSIS — Z1211 Encounter for screening for malignant neoplasm of colon: Secondary | ICD-10-CM | POA: Diagnosis not present

## 2012-04-21 DIAGNOSIS — G4733 Obstructive sleep apnea (adult) (pediatric): Secondary | ICD-10-CM | POA: Diagnosis not present

## 2012-11-29 DIAGNOSIS — Z23 Encounter for immunization: Secondary | ICD-10-CM | POA: Diagnosis not present

## 2013-01-03 DIAGNOSIS — Z808 Family history of malignant neoplasm of other organs or systems: Secondary | ICD-10-CM | POA: Diagnosis not present

## 2013-01-03 DIAGNOSIS — D239 Other benign neoplasm of skin, unspecified: Secondary | ICD-10-CM | POA: Diagnosis not present

## 2013-01-03 DIAGNOSIS — L821 Other seborrheic keratosis: Secondary | ICD-10-CM | POA: Diagnosis not present

## 2013-09-08 DIAGNOSIS — Z23 Encounter for immunization: Secondary | ICD-10-CM | POA: Diagnosis not present

## 2013-09-08 DIAGNOSIS — Z0000 Encounter for general adult medical examination without abnormal findings: Secondary | ICD-10-CM | POA: Diagnosis not present

## 2013-09-08 DIAGNOSIS — IMO0001 Reserved for inherently not codable concepts without codable children: Secondary | ICD-10-CM | POA: Diagnosis not present

## 2013-09-09 DIAGNOSIS — IMO0001 Reserved for inherently not codable concepts without codable children: Secondary | ICD-10-CM | POA: Diagnosis not present

## 2013-09-09 DIAGNOSIS — Z125 Encounter for screening for malignant neoplasm of prostate: Secondary | ICD-10-CM | POA: Diagnosis not present

## 2013-09-09 DIAGNOSIS — Z136 Encounter for screening for cardiovascular disorders: Secondary | ICD-10-CM | POA: Diagnosis not present

## 2013-09-15 DIAGNOSIS — IMO0001 Reserved for inherently not codable concepts without codable children: Secondary | ICD-10-CM | POA: Diagnosis not present

## 2013-10-10 DIAGNOSIS — Z1211 Encounter for screening for malignant neoplasm of colon: Secondary | ICD-10-CM | POA: Diagnosis not present

## 2013-10-27 ENCOUNTER — Encounter: Payer: Medicare Other | Attending: Family Medicine | Admitting: *Deleted

## 2013-10-27 ENCOUNTER — Encounter: Payer: Self-pay | Admitting: *Deleted

## 2013-10-27 VITALS — Ht 70.0 in | Wt 221.1 lb

## 2013-10-27 DIAGNOSIS — Z713 Dietary counseling and surveillance: Secondary | ICD-10-CM | POA: Diagnosis not present

## 2013-10-27 DIAGNOSIS — E119 Type 2 diabetes mellitus without complications: Secondary | ICD-10-CM | POA: Insufficient documentation

## 2013-10-27 NOTE — Patient Instructions (Addendum)
Plan:  Aim for 3-4  Carb Choices per meal (45-60 grams) +/- 1 carb choice either way  Aim for 1-2 Carbs per snack if hungry or symptoms of low blood sugar present Include protein in moderation with your meals and snacks Consider reading food labels for Total Carbohydrate of foods Consider  continuing your activity level of 30 minutes daily as tolerated Consider checking BG fasting and at alternate times per day as directed by MD  Continue  taking medicationas directed by MD

## 2013-11-01 NOTE — Progress Notes (Signed)
Medical Nutrition Therapy: Appt start time: 0800 end time: 0930.  Assessment: Primary concerns today: He wants to know how to eat to control his blood sugars better. His wife is with him today. He brought his blood sugar readings with him today. He has been a diabetic for about 7 years and states his blood sugars were normal up until this last doctor's appointment in July.Marland Kitchen He is mostly checking blood sugars 2 hours after meals. BS ranges are 115 - 262 mg/dl post meals. Hasn't been checking fasting. BS are much better now that he is taking Metformin and Glimeperide daily. He was informed by his doctor to avoid 'white' foods. He walks daily with his wife most days of the week. His wife does most of the cooking and shopping. Current A1C 12.7%. He notes his A1C a year ago was about 6% he thinks. He denies symptoms of high blood sugars but admits to some low blood sugars symptoms(shakiness, weakness and sudden sweats.) if he goes too long between meals.  254 lbs 1 yr ago, 233 lbs last physician appointment in July and 231 lbs most recently.   Preferred Learning Style:  No preference indicated   Readiness to change:  in progress by changing what he is eating; avoiding white foods and trying to eat smaller portions.  MEDICATIONS: See list below   DIETARY INTAKE:  Usual eating pattern includes 3 meals and 1-2 snacks per day.   Everyday foods include meat and vegetables Avoided foods include 'white foods.'.   24-hr recall:  B ( AM): 6-630 Cereal and flavored oatmeal; bacon and eggs once a week; skim milk with ice  Snk ( AM): wheat crackers, grapes, cheese water  L ( PM):eats out; eats chicken or burgers, pintos, diet coke  Snk ( PM): wheat crackers with cheese or apple. water  D ( PM): 3 scoops of chicken salad and 6 crackers, water 16 oz  Snk ( PM):popcorn,  Beverages: water, diet coke   Usual physical activity: squirrel hunting and dove hunting. Walks daily about 2 miles most days of the week with  his wife..   Estimated energy needs:  2000 calories  225 g carbohydrates  150 g protein  56 g fat   Progress Towards Goal(s): In progress.   Nutritional Diagnosis:  NB-1.1 Food and nutrition-related knowledge deficit As related to Uncontrolled Diabetes As evidenced by 12.7% .  Intervention: Nutrition Carb counting, meal planning, portion control, and exercise.   Plan:  Aim for 3-4  Carb Choices per meal (45-60 grams) +/- 1 carb choice either way  Aim for 1-2 Carbs per snack if hungry or symptoms of low blood sugar present Include protein in moderation with your meals and snacks Consider reading food labels for Total Carbohydrate of foods Consider  continuing your activity level of 30 minutes daily as tolerated Consider checking BG fasting and at alternate times per day as directed by MD  Continue  taking medicationas directed by MD  Teaching Method Utilized:  Visual  Auditory  Hands on   Handouts given during visit include:  Living Well with Diabetes  Carb Counting and Food Label handouts  Meal Plan  Barriers to learning/adherence to lifestyle change: none  Demonstrated degree of understanding via: Teach Back  Monitoring/Evaluation: Dietary intake, exercise, Plate Method, and body weight prn.

## 2013-11-25 DIAGNOSIS — Z23 Encounter for immunization: Secondary | ICD-10-CM | POA: Diagnosis not present

## 2013-12-27 DIAGNOSIS — E119 Type 2 diabetes mellitus without complications: Secondary | ICD-10-CM | POA: Diagnosis not present

## 2014-01-02 DIAGNOSIS — E119 Type 2 diabetes mellitus without complications: Secondary | ICD-10-CM | POA: Diagnosis not present

## 2014-05-10 DIAGNOSIS — D0361 Melanoma in situ of right upper limb, including shoulder: Secondary | ICD-10-CM | POA: Diagnosis not present

## 2014-05-10 DIAGNOSIS — L821 Other seborrheic keratosis: Secondary | ICD-10-CM | POA: Diagnosis not present

## 2014-05-10 DIAGNOSIS — D225 Melanocytic nevi of trunk: Secondary | ICD-10-CM | POA: Diagnosis not present

## 2014-05-10 DIAGNOSIS — Z86018 Personal history of other benign neoplasm: Secondary | ICD-10-CM | POA: Diagnosis not present

## 2014-05-10 DIAGNOSIS — D485 Neoplasm of uncertain behavior of skin: Secondary | ICD-10-CM | POA: Diagnosis not present

## 2014-05-23 DIAGNOSIS — D0361 Melanoma in situ of right upper limb, including shoulder: Secondary | ICD-10-CM | POA: Diagnosis not present

## 2014-05-23 DIAGNOSIS — D0359 Melanoma in situ of other part of trunk: Secondary | ICD-10-CM | POA: Diagnosis not present

## 2014-09-25 DIAGNOSIS — Z125 Encounter for screening for malignant neoplasm of prostate: Secondary | ICD-10-CM | POA: Diagnosis not present

## 2014-09-25 DIAGNOSIS — E119 Type 2 diabetes mellitus without complications: Secondary | ICD-10-CM | POA: Diagnosis not present

## 2014-09-28 DIAGNOSIS — E119 Type 2 diabetes mellitus without complications: Secondary | ICD-10-CM | POA: Diagnosis not present

## 2014-09-28 DIAGNOSIS — E780 Pure hypercholesterolemia: Secondary | ICD-10-CM | POA: Diagnosis not present

## 2014-09-28 DIAGNOSIS — Z1211 Encounter for screening for malignant neoplasm of colon: Secondary | ICD-10-CM | POA: Diagnosis not present

## 2014-09-28 DIAGNOSIS — N529 Male erectile dysfunction, unspecified: Secondary | ICD-10-CM | POA: Diagnosis not present

## 2014-09-28 DIAGNOSIS — Z0000 Encounter for general adult medical examination without abnormal findings: Secondary | ICD-10-CM | POA: Diagnosis not present

## 2014-10-16 DIAGNOSIS — Z1211 Encounter for screening for malignant neoplasm of colon: Secondary | ICD-10-CM | POA: Diagnosis not present

## 2014-11-22 DIAGNOSIS — Z23 Encounter for immunization: Secondary | ICD-10-CM | POA: Diagnosis not present

## 2014-12-01 DIAGNOSIS — N202 Calculus of kidney with calculus of ureter: Secondary | ICD-10-CM | POA: Diagnosis not present

## 2014-12-01 DIAGNOSIS — N200 Calculus of kidney: Secondary | ICD-10-CM | POA: Diagnosis not present

## 2014-12-01 DIAGNOSIS — K8020 Calculus of gallbladder without cholecystitis without obstruction: Secondary | ICD-10-CM | POA: Diagnosis not present

## 2014-12-01 DIAGNOSIS — N400 Enlarged prostate without lower urinary tract symptoms: Secondary | ICD-10-CM | POA: Diagnosis not present

## 2015-04-03 DIAGNOSIS — M1711 Unilateral primary osteoarthritis, right knee: Secondary | ICD-10-CM | POA: Diagnosis not present

## 2015-04-03 DIAGNOSIS — M1712 Unilateral primary osteoarthritis, left knee: Secondary | ICD-10-CM | POA: Diagnosis not present

## 2015-04-03 DIAGNOSIS — M545 Low back pain: Secondary | ICD-10-CM | POA: Diagnosis not present

## 2015-04-05 DIAGNOSIS — M545 Low back pain: Secondary | ICD-10-CM | POA: Diagnosis not present

## 2015-04-09 DIAGNOSIS — M4806 Spinal stenosis, lumbar region: Secondary | ICD-10-CM | POA: Diagnosis not present

## 2015-04-09 DIAGNOSIS — M5416 Radiculopathy, lumbar region: Secondary | ICD-10-CM | POA: Diagnosis not present

## 2015-04-13 DIAGNOSIS — M4806 Spinal stenosis, lumbar region: Secondary | ICD-10-CM | POA: Diagnosis not present

## 2015-04-13 DIAGNOSIS — M5416 Radiculopathy, lumbar region: Secondary | ICD-10-CM | POA: Diagnosis not present

## 2015-04-17 DIAGNOSIS — M4806 Spinal stenosis, lumbar region: Secondary | ICD-10-CM | POA: Diagnosis not present

## 2015-04-17 DIAGNOSIS — M5416 Radiculopathy, lumbar region: Secondary | ICD-10-CM | POA: Diagnosis not present

## 2015-04-18 DIAGNOSIS — M545 Low back pain: Secondary | ICD-10-CM | POA: Diagnosis not present

## 2015-04-20 DIAGNOSIS — M4806 Spinal stenosis, lumbar region: Secondary | ICD-10-CM | POA: Diagnosis not present

## 2015-04-20 DIAGNOSIS — M5416 Radiculopathy, lumbar region: Secondary | ICD-10-CM | POA: Diagnosis not present

## 2015-04-24 DIAGNOSIS — M5416 Radiculopathy, lumbar region: Secondary | ICD-10-CM | POA: Diagnosis not present

## 2015-04-24 DIAGNOSIS — M4806 Spinal stenosis, lumbar region: Secondary | ICD-10-CM | POA: Diagnosis not present

## 2015-04-27 DIAGNOSIS — M4806 Spinal stenosis, lumbar region: Secondary | ICD-10-CM | POA: Diagnosis not present

## 2015-04-27 DIAGNOSIS — M5416 Radiculopathy, lumbar region: Secondary | ICD-10-CM | POA: Diagnosis not present

## 2015-05-01 DIAGNOSIS — M5416 Radiculopathy, lumbar region: Secondary | ICD-10-CM | POA: Diagnosis not present

## 2015-05-01 DIAGNOSIS — M4806 Spinal stenosis, lumbar region: Secondary | ICD-10-CM | POA: Diagnosis not present

## 2015-05-08 DIAGNOSIS — M4806 Spinal stenosis, lumbar region: Secondary | ICD-10-CM | POA: Diagnosis not present

## 2015-05-08 DIAGNOSIS — M5416 Radiculopathy, lumbar region: Secondary | ICD-10-CM | POA: Diagnosis not present

## 2015-05-09 DIAGNOSIS — M4806 Spinal stenosis, lumbar region: Secondary | ICD-10-CM | POA: Diagnosis not present

## 2015-05-11 DIAGNOSIS — M5416 Radiculopathy, lumbar region: Secondary | ICD-10-CM | POA: Diagnosis not present

## 2015-05-11 DIAGNOSIS — M4806 Spinal stenosis, lumbar region: Secondary | ICD-10-CM | POA: Diagnosis not present

## 2015-05-14 DIAGNOSIS — L821 Other seborrheic keratosis: Secondary | ICD-10-CM | POA: Diagnosis not present

## 2015-05-14 DIAGNOSIS — Z23 Encounter for immunization: Secondary | ICD-10-CM | POA: Diagnosis not present

## 2015-05-14 DIAGNOSIS — D485 Neoplasm of uncertain behavior of skin: Secondary | ICD-10-CM | POA: Diagnosis not present

## 2015-05-14 DIAGNOSIS — L820 Inflamed seborrheic keratosis: Secondary | ICD-10-CM | POA: Diagnosis not present

## 2015-05-14 DIAGNOSIS — Z87898 Personal history of other specified conditions: Secondary | ICD-10-CM | POA: Diagnosis not present

## 2015-05-14 DIAGNOSIS — D225 Melanocytic nevi of trunk: Secondary | ICD-10-CM | POA: Diagnosis not present

## 2015-05-14 DIAGNOSIS — Z86018 Personal history of other benign neoplasm: Secondary | ICD-10-CM | POA: Diagnosis not present

## 2015-05-14 DIAGNOSIS — L905 Scar conditions and fibrosis of skin: Secondary | ICD-10-CM | POA: Diagnosis not present

## 2015-05-18 DIAGNOSIS — M4806 Spinal stenosis, lumbar region: Secondary | ICD-10-CM | POA: Diagnosis not present

## 2015-05-18 DIAGNOSIS — M5416 Radiculopathy, lumbar region: Secondary | ICD-10-CM | POA: Diagnosis not present

## 2015-05-22 DIAGNOSIS — M5416 Radiculopathy, lumbar region: Secondary | ICD-10-CM | POA: Diagnosis not present

## 2015-05-22 DIAGNOSIS — M4806 Spinal stenosis, lumbar region: Secondary | ICD-10-CM | POA: Diagnosis not present

## 2015-05-25 DIAGNOSIS — M5416 Radiculopathy, lumbar region: Secondary | ICD-10-CM | POA: Diagnosis not present

## 2015-05-25 DIAGNOSIS — M4806 Spinal stenosis, lumbar region: Secondary | ICD-10-CM | POA: Diagnosis not present

## 2015-05-29 DIAGNOSIS — M4806 Spinal stenosis, lumbar region: Secondary | ICD-10-CM | POA: Diagnosis not present

## 2015-06-01 DIAGNOSIS — M4806 Spinal stenosis, lumbar region: Secondary | ICD-10-CM | POA: Diagnosis not present

## 2015-06-01 DIAGNOSIS — M5416 Radiculopathy, lumbar region: Secondary | ICD-10-CM | POA: Diagnosis not present

## 2015-06-08 DIAGNOSIS — D0361 Melanoma in situ of right upper limb, including shoulder: Secondary | ICD-10-CM | POA: Diagnosis not present

## 2015-06-08 DIAGNOSIS — D0359 Melanoma in situ of other part of trunk: Secondary | ICD-10-CM | POA: Diagnosis not present

## 2015-06-11 DIAGNOSIS — M4806 Spinal stenosis, lumbar region: Secondary | ICD-10-CM | POA: Diagnosis not present

## 2015-10-16 DIAGNOSIS — E119 Type 2 diabetes mellitus without complications: Secondary | ICD-10-CM | POA: Diagnosis not present

## 2015-10-16 DIAGNOSIS — Z7984 Long term (current) use of oral hypoglycemic drugs: Secondary | ICD-10-CM | POA: Diagnosis not present

## 2015-10-16 DIAGNOSIS — Z125 Encounter for screening for malignant neoplasm of prostate: Secondary | ICD-10-CM | POA: Diagnosis not present

## 2015-10-16 DIAGNOSIS — E7800 Pure hypercholesterolemia, unspecified: Secondary | ICD-10-CM | POA: Diagnosis not present

## 2015-10-19 DIAGNOSIS — Z6836 Body mass index (BMI) 36.0-36.9, adult: Secondary | ICD-10-CM | POA: Diagnosis not present

## 2015-10-19 DIAGNOSIS — E7800 Pure hypercholesterolemia, unspecified: Secondary | ICD-10-CM | POA: Diagnosis not present

## 2015-10-19 DIAGNOSIS — Z7984 Long term (current) use of oral hypoglycemic drugs: Secondary | ICD-10-CM | POA: Diagnosis not present

## 2015-10-19 DIAGNOSIS — Z125 Encounter for screening for malignant neoplasm of prostate: Secondary | ICD-10-CM | POA: Diagnosis not present

## 2015-10-19 DIAGNOSIS — Z0000 Encounter for general adult medical examination without abnormal findings: Secondary | ICD-10-CM | POA: Diagnosis not present

## 2015-10-19 DIAGNOSIS — E1165 Type 2 diabetes mellitus with hyperglycemia: Secondary | ICD-10-CM | POA: Diagnosis not present

## 2015-10-19 DIAGNOSIS — G4733 Obstructive sleep apnea (adult) (pediatric): Secondary | ICD-10-CM | POA: Diagnosis not present

## 2015-11-21 DIAGNOSIS — R309 Painful micturition, unspecified: Secondary | ICD-10-CM | POA: Diagnosis not present

## 2015-11-21 DIAGNOSIS — N419 Inflammatory disease of prostate, unspecified: Secondary | ICD-10-CM | POA: Diagnosis not present

## 2015-12-11 DIAGNOSIS — Z23 Encounter for immunization: Secondary | ICD-10-CM | POA: Diagnosis not present

## 2015-12-11 DIAGNOSIS — Z86018 Personal history of other benign neoplasm: Secondary | ICD-10-CM | POA: Diagnosis not present

## 2015-12-11 DIAGNOSIS — D485 Neoplasm of uncertain behavior of skin: Secondary | ICD-10-CM | POA: Diagnosis not present

## 2015-12-11 DIAGNOSIS — D2271 Melanocytic nevi of right lower limb, including hip: Secondary | ICD-10-CM | POA: Diagnosis not present

## 2015-12-11 DIAGNOSIS — D2261 Melanocytic nevi of right upper limb, including shoulder: Secondary | ICD-10-CM | POA: Diagnosis not present

## 2015-12-11 DIAGNOSIS — D225 Melanocytic nevi of trunk: Secondary | ICD-10-CM | POA: Diagnosis not present

## 2015-12-11 DIAGNOSIS — L821 Other seborrheic keratosis: Secondary | ICD-10-CM | POA: Diagnosis not present

## 2015-12-11 DIAGNOSIS — Z86008 Personal history of in-situ neoplasm of other site: Secondary | ICD-10-CM | POA: Diagnosis not present

## 2015-12-21 DIAGNOSIS — L988 Other specified disorders of the skin and subcutaneous tissue: Secondary | ICD-10-CM | POA: Diagnosis not present

## 2015-12-21 DIAGNOSIS — D485 Neoplasm of uncertain behavior of skin: Secondary | ICD-10-CM | POA: Diagnosis not present

## 2015-12-26 DIAGNOSIS — Z23 Encounter for immunization: Secondary | ICD-10-CM | POA: Diagnosis not present

## 2015-12-26 DIAGNOSIS — R3121 Asymptomatic microscopic hematuria: Secondary | ICD-10-CM | POA: Diagnosis not present

## 2016-01-01 DIAGNOSIS — R319 Hematuria, unspecified: Secondary | ICD-10-CM | POA: Diagnosis not present

## 2016-01-01 DIAGNOSIS — R3121 Asymptomatic microscopic hematuria: Secondary | ICD-10-CM | POA: Diagnosis not present

## 2016-01-03 DIAGNOSIS — L988 Other specified disorders of the skin and subcutaneous tissue: Secondary | ICD-10-CM | POA: Diagnosis not present

## 2016-01-03 DIAGNOSIS — D485 Neoplasm of uncertain behavior of skin: Secondary | ICD-10-CM | POA: Diagnosis not present

## 2016-01-08 DIAGNOSIS — N210 Calculus in bladder: Secondary | ICD-10-CM | POA: Diagnosis not present

## 2016-01-08 DIAGNOSIS — R3121 Asymptomatic microscopic hematuria: Secondary | ICD-10-CM | POA: Diagnosis not present

## 2016-01-08 DIAGNOSIS — N281 Cyst of kidney, acquired: Secondary | ICD-10-CM | POA: Diagnosis not present

## 2016-01-10 ENCOUNTER — Other Ambulatory Visit: Payer: Self-pay | Admitting: Urology

## 2016-01-15 ENCOUNTER — Encounter (HOSPITAL_BASED_OUTPATIENT_CLINIC_OR_DEPARTMENT_OTHER): Payer: Self-pay | Admitting: *Deleted

## 2016-01-18 ENCOUNTER — Encounter (HOSPITAL_BASED_OUTPATIENT_CLINIC_OR_DEPARTMENT_OTHER): Payer: Self-pay | Admitting: *Deleted

## 2016-01-18 NOTE — Progress Notes (Signed)
NPO AFTER MN.  ARRIVE AT 0600.  NEEDS ISTAT AND EKG.  WILL TAKE ALLEGRA AND CRESTOR AM DOS W/ SIPS OF WATER AND IF NEEDED TAKE TRAMADOL.

## 2016-01-20 NOTE — Anesthesia Preprocedure Evaluation (Addendum)
Anesthesia Evaluation  Patient identified by MRN, date of birth, ID band Patient awake    Reviewed: Allergy & Precautions, NPO status , Patient's Chart, lab work & pertinent test results  History of Anesthesia Complications Negative for: history of anesthetic complications  Airway Mallampati: III  TM Distance: >3 FB Neck ROM: Full    Dental  (+) Partial Upper   Pulmonary neg shortness of breath, sleep apnea and Continuous Positive Airway Pressure Ventilation , neg COPD, Recent URI , Resolved, Current Smoker,    Pulmonary exam normal breath sounds clear to auscultation       Cardiovascular (-) hypertension(-) angina(-) CAD, (-) Past MI, (-) Cardiac Stents, (-) Orthopnea, (-) PND and (-) DOE (-) dysrhythmias  Rhythm:Regular Rate:Tachycardia  HLD   Neuro/Psych neg Seizures negative neurological ROS     GI/Hepatic negative GI ROS, Neg liver ROS, neg GERD  ,  Endo/Other  diabetes, Type 2, Oral Hypoglycemic Agents  Renal/GU Renal disease (h/o nephrolithiasis, hematuria)     Musculoskeletal  (+) Arthritis ,   Abdominal (+) + obese,   Peds  Hematology negative hematology ROS (+)   Anesthesia Other Findings   Reproductive/Obstetrics                            Anesthesia Physical Anesthesia Plan  ASA: III  Anesthesia Plan: General   Post-op Pain Management:    Induction: Intravenous  Airway Management Planned: LMA  Additional Equipment:   Intra-op Plan:   Post-operative Plan: Extubation in OR  Informed Consent: I have reviewed the patients History and Physical, chart, labs and discussed the procedure including the risks, benefits and alternatives for the proposed anesthesia with the patient or authorized representative who has indicated his/her understanding and acceptance.   Dental advisory given  Plan Discussed with: CRNA  Anesthesia Plan Comments: (Risks of general anesthesia  discussed including, but not limited to, sore throat, hoarse voice, chipped/damaged teeth, injury to vocal cords, nausea and vomiting, allergic reactions, lung infection, heart attack, stroke, and death. All questions answered. )       Anesthesia Quick Evaluation

## 2016-01-20 NOTE — H&P (Signed)
HPI: Kenneth Chen is a 70 year-old male with bladder calculi and bladder lesions.  He has not seen blood in his urine. He denies any other associated symptoms.   He is not having pain. His last U/S or CT Scan was 01/01/2016.   He noted discomfort at the termination of urination and also when sitting on a hard surface. This was associated with increased urinary frequency. A urinalysis was found to be negative.   He was placed on empiric Cipro for one month and noted that some of the pain has resolved but he still has some slight aching in the perineal region especially when he sits. He has not seen any change in his urinary stream and also reports not experiencing any gross hematuria. He has no history of bladder stones. He does however have a history of kidney stones. He reports having passed a stone 30 years ago, 15 years ago and then 2 stones over the past year. He currently denies any flank pain. He has smoked an occasional cigar in the past but has not been exposed to any industrial chemicals. He reports that in 6/17 he had a episode where his urinary stream suddenly cut off and then sprayed. After he flushed the toilet he thought he might have seen a small stone as well.      ALLERGIES: None   MEDICATIONS: Aspirin  Crestor 5 mg tablet  Metformin Hcl 500 mg tablet  Pyridium 200 mg tablet 1 tablet PO Q 8 H  Tramadol Hcl 50 mg tablet 1 tablet PO Q 4-6 H PRN  Allegra Allergy  Glimepiride 2 mg tablet  Multivitamin     GU PSH: Locm 300-399Mg /Ml Iodine,1Ml - 01/01/2016    NON-GU PSH: None   GU PMH: Asymptomatic microscopic hematuria, I have discussed with the patient that the evaluation of asymptomatic microscopic hematuria consists of excluding benign causes including menstruation in women as well as, in both sexes, vigorous exercise, sexual activity, viral illnesses, trauma and infection. If these factors are negative and the urinalysis reveals significant proteinuria, dysmorphic red  blood cells or red cell casts and/or an elevated serum creatinine then evaluation by a nephrologist is indicated. If conditions suggestive of primary renal disease are not present and the patient has a low risk of urothelial malignancy consisting of age less than 40 years, no smoking history, no history of chemical exposure, no irritative voiding symptoms, no history of gross hematuria and no history of urologic disorder or disease then upper tract imaging should be undertaken with a CT scan and consideration of cystoscopy versus urine cytology as well. If, on the other hand the patient is at high risk then a complete evaluation should be undertaken including upper tract imaging with CT scan and cystoscopy and consideration of urine cytology as well. - 12/26/2015 Bladder Stone BPH w/LUTS Renal Cysts, Simple, Bilateral    NON-GU PMH: Diabetes Type 2 Hypercholesterolemia Sleep Apnea    FAMILY HISTORY: Diabetes - Brother, Mother, Uncle   SOCIAL HISTORY: Marital Status: Married Current Smoking Status: Patient smokes occasionally.  Has never drank.  Does not drink caffeine.    REVIEW OF SYSTEMS:    GU Review Male:   Patient denies frequent urination, hard to postpone urination, burning/ pain with urination, get up at night to urinate, leakage of urine, stream starts and stops, trouble starting your stream, have to strain to urinate , erection problems, and penile pain.  Gastrointestinal (Upper):   Patient denies nausea, vomiting, and indigestion/ heartburn.  Gastrointestinal (  Lower):   Patient denies diarrhea and constipation.  Constitutional:   Patient denies fever, night sweats, weight loss, and fatigue.  Skin:   Patient denies skin rash/ lesion and itching.  Eyes:   Patient denies blurred vision and double vision.  Ears/ Nose/ Throat:   Patient denies sore throat and sinus problems.  Hematologic/Lymphatic:   Patient denies swollen glands and easy bruising.  Cardiovascular:   Patient denies  leg swelling and chest pains.  Respiratory:   Patient denies cough and shortness of breath.  Endocrine:   Patient denies excessive thirst.  Musculoskeletal:   Patient denies back pain and joint pain.  Neurological:   Patient denies headaches and dizziness.  Psychologic:   Patient denies depression and anxiety.   VITAL SIGNS:    Weight 232 lb / 105.23 kg  Height 71 in / 180.34 cm  BP 156/87 mmHg  Pulse 94 /min  BMI 32.4 kg/m   GU PHYSICAL EXAMINATION:    Anus and Perineum: No hemorrhoids. No anal stenosis. No rectal fissure, no anal fissure. No edema, no dimple, no perineal tenderness, no anal tenderness.  Scrotum: No lesions. No edema. No cysts. No warts.  Epididymides: Right: no spermatocele, no masses, no cysts, no tenderness, no induration, no enlargement. Left: no spermatocele, no masses, no cysts, no tenderness, no induration, no enlargement.  Testes: No tenderness, no swelling, no enlargement left testes. No tenderness, no swelling, no enlargement right testes. Normal location left testes. Normal location right testes. No mass, no cyst, no varicocele, no hydrocele left testes. No mass, no cyst, no varicocele, no hydrocele right testes.  Urethral Meatus: Normal size. No lesion, no wart, no discharge, no polyp. Normal location.  Penis: Circumcised, no foreskin warts, no cracks. No dorsal peyronie's plaques, no left corporal peyronie's plaques, no right corporal peyronie's plaques, no scarring, no shaft warts. No balanitis, no meatal stenosis.   Prostate: Prostate 2 1/2+ size. Left lobe normal consistency, right lobe normal consistency. Symmetrical lobes. No prostate nodule. Left lobe no tenderness, right lobe no tenderness.   Seminal Vesicles: Nonpalpable.  Sphincter Tone: Normal sphincter. No rectal tenderness. No rectal mass.    MULTI-SYSTEM PHYSICAL EXAMINATION:    Constitutional: Well-nourished. No physical deformities. Normally developed. Good grooming.  Neck: Neck symmetrical,  not swollen. Normal tracheal position.  Respiratory: No labored breathing, no use of accessory muscles.   Cardiovascular: Normal temperature, normal extremity pulses, no swelling, no varicosities.  Lymphatic: No enlargement of neck, axillae, groin.  Skin: No paleness, no jaundice, no cyanosis. No lesion, no ulcer, no rash.  Neurologic / Psychiatric: Oriented to time, oriented to place, oriented to person. No depression, no anxiety, no agitation.  Gastrointestinal: No mass, no tenderness, no rigidity, non obese abdomen.  Eyes: Normal conjunctivae. Normal eyelids.  Ears, Nose, Mouth, and Throat: Left ear no scars, no lesions, no masses. Right ear no scars, no lesions, no masses. Nose no scars, no lesions, no masses. Normal hearing. Normal lips.  Musculoskeletal: Normal gait and station of head and neck.    PAST DATA REVIEWED:  Source Of History:  Patient  Lab Test Review:   BUN/Creatinine  Records Review:   Previous Patient Records, POC Tool  X-Ray Review: C.T. Hematuria: Reviewed Films. Reviewed Report. Discussed With Patient.     10/16/15 09/25/14 09/09/13  PSA  Total PSA 2.21 ng/dl 2.21 ng/dl 2.26 ng/dl   Notes:                     his  creatinine was found to be normal 1.0.   PROCEDURES:         Flexible Cystoscopy - done 01/08/16  Risks, benefits, and some of the potential complications of the procedure were discussed at length with the patient including infection, bleeding, voiding discomfort, urinary retention, fever, chills, sepsis, and others. All questions were answered. Informed consent was obtained. Sterile technique and 2% Lidocaine intraurethral analgesia were used.  Meatus:  Normal size. Normal location. Normal condition.  Urethra:  No strictures.  External Sphincter:  Normal.  Verumontanum:  Normal.  Prostate:  Obstructing. Moderate hyperplasia.  Bladder Neck:  Non-obstructing.  Ureteral Orifices:  Normal location. Normal size. Normal shape. Effluxed clear urine.   Bladder:  Many large stones. No trabeculation. No tumors. Normal mucosa.      The lower urinary tract was carefully examined. The procedure was well-tolerated and without complications. Instructions were given to call the office immediately for bloody urine, difficulty urinating, urinary retention, painful or frequent urination, fever or other illness. The patient stated that he understood these instructions and would comply with them.         Urinalysis w/Scope Dipstick Dipstick Cont'd Micro  Color: Yellow Bilirubin: Neg WBC/hpf: 0 - 5/hpf  Appearance: Cloudy Ketones: Neg RBC/hpf: >60/hpf  Specific Gravity: 1.025 Blood: 3+ Bacteria: Rare (0-9/hpf)  pH: 5.5 Protein: 1+ Cystals: Amorph Urates  Glucose: 3+ Urobilinogen: 0.2 Casts: NS (Not Seen)    Nitrites: Neg Trichomonas: Not Present    Leukocyte Esterase: Neg Mucous: Present      Epithelial Cells: 0 - 5/hpf      Yeast: NS (Not Seen)      Sperm: Not Present    ASSESSMENT:      ICD-10 Details  1 GU:   Bladder Stone - N21.0 He has several large bladder stones and we therefore discussed management. I told him that these can be addressed cystoscopically. I went over the procedure with him in detail including its risks and complications, alternatives, the probability of success as well in the outpatient nature of the procedure and the anticipated postoperative course.  2   Asymptomatic microscopic hematuria - R31.21 Stable - Microscopic hematuria is most likely due to bladder calculi. He did however have several areas on the lining of his bladder that appeared almost papillary and will need to be biopsied as they could be TCCa versus irritation from the presence of his bladder calculi.  3   Renal Cysts, Simple - N28.1 Bilateral, Bilateral, simple renal cysts were noted on his CT scan of clinical significance.              Notes:   In addition to treating his bladder stones I told him that I would also obtain biopsies from the areas of the  bladder that appeared suspicious for possible bladder cancer versus inflammation from the presence of his stones.  Although he does have an enlarged prostate he has no voiding symptoms and really did not have significant changes of the bladder to suggest difficulty with outlet obstruction.    PLAN:   Cystolitholapaxy and bladder biopsy

## 2016-01-21 ENCOUNTER — Ambulatory Visit (HOSPITAL_BASED_OUTPATIENT_CLINIC_OR_DEPARTMENT_OTHER): Payer: Medicare Other | Admitting: Anesthesiology

## 2016-01-21 ENCOUNTER — Encounter (HOSPITAL_BASED_OUTPATIENT_CLINIC_OR_DEPARTMENT_OTHER)
Admission: RE | Disposition: A | Discharge status: Home / Self Care | Payer: Self-pay | Source: Ambulatory Visit | Attending: Urology

## 2016-01-21 ENCOUNTER — Ambulatory Visit (HOSPITAL_BASED_OUTPATIENT_CLINIC_OR_DEPARTMENT_OTHER)
Admission: RE | Admit: 2016-01-21 | Discharge: 2016-01-21 | Disposition: A | Discharge status: Home / Self Care | Payer: Medicare Other | Source: Ambulatory Visit | Attending: Urology | Admitting: Urology

## 2016-01-21 ENCOUNTER — Encounter (HOSPITAL_BASED_OUTPATIENT_CLINIC_OR_DEPARTMENT_OTHER): Payer: Self-pay | Admitting: *Deleted

## 2016-01-21 DIAGNOSIS — N3020 Other chronic cystitis without hematuria: Secondary | ICD-10-CM | POA: Diagnosis not present

## 2016-01-21 DIAGNOSIS — Z7984 Long term (current) use of oral hypoglycemic drugs: Secondary | ICD-10-CM | POA: Diagnosis not present

## 2016-01-21 DIAGNOSIS — E119 Type 2 diabetes mellitus without complications: Secondary | ICD-10-CM | POA: Diagnosis not present

## 2016-01-21 DIAGNOSIS — R000 Tachycardia, unspecified: Secondary | ICD-10-CM | POA: Insufficient documentation

## 2016-01-21 DIAGNOSIS — Z87442 Personal history of urinary calculi: Secondary | ICD-10-CM | POA: Insufficient documentation

## 2016-01-21 DIAGNOSIS — Z7982 Long term (current) use of aspirin: Secondary | ICD-10-CM | POA: Diagnosis not present

## 2016-01-21 DIAGNOSIS — N281 Cyst of kidney, acquired: Secondary | ICD-10-CM | POA: Diagnosis not present

## 2016-01-21 DIAGNOSIS — Z6834 Body mass index (BMI) 34.0-34.9, adult: Secondary | ICD-10-CM | POA: Insufficient documentation

## 2016-01-21 DIAGNOSIS — F17200 Nicotine dependence, unspecified, uncomplicated: Secondary | ICD-10-CM | POA: Diagnosis not present

## 2016-01-21 DIAGNOSIS — M1990 Unspecified osteoarthritis, unspecified site: Secondary | ICD-10-CM | POA: Diagnosis not present

## 2016-01-21 DIAGNOSIS — E669 Obesity, unspecified: Secondary | ICD-10-CM | POA: Insufficient documentation

## 2016-01-21 DIAGNOSIS — N210 Calculus in bladder: Secondary | ICD-10-CM | POA: Insufficient documentation

## 2016-01-21 DIAGNOSIS — D494 Neoplasm of unspecified behavior of bladder: Secondary | ICD-10-CM | POA: Diagnosis not present

## 2016-01-21 DIAGNOSIS — G4730 Sleep apnea, unspecified: Secondary | ICD-10-CM | POA: Insufficient documentation

## 2016-01-21 HISTORY — DX: Personal history of malignant melanoma of skin: Z85.820

## 2016-01-21 HISTORY — DX: Bladder disorder, unspecified: N32.9

## 2016-01-21 HISTORY — DX: Calculus in bladder: N21.0

## 2016-01-21 HISTORY — DX: Allergic rhinitis, unspecified: J30.9

## 2016-01-21 HISTORY — DX: Dependence on other enabling machines and devices: Z99.89

## 2016-01-21 HISTORY — DX: Personal history of urinary calculi: Z87.442

## 2016-01-21 HISTORY — DX: Obstructive sleep apnea (adult) (pediatric): G47.33

## 2016-01-21 HISTORY — DX: Type 2 diabetes mellitus without complications: E11.9

## 2016-01-21 HISTORY — PX: CYSTOSCOPY WITH LITHOLAPAXY: SHX1425

## 2016-01-21 HISTORY — PX: CYSTOSCOPY WITH BIOPSY: SHX5122

## 2016-01-21 HISTORY — DX: Hematuria, unspecified: R31.9

## 2016-01-21 HISTORY — DX: Hyperlipidemia, unspecified: E78.5

## 2016-01-21 HISTORY — PX: HOLMIUM LASER APPLICATION: SHX5852

## 2016-01-21 HISTORY — DX: Unspecified symptoms and signs involving the genitourinary system: R39.9

## 2016-01-21 HISTORY — DX: Unspecified osteoarthritis, unspecified site: M19.90

## 2016-01-21 HISTORY — DX: Presence of dental prosthetic device (complete) (partial): Z97.2

## 2016-01-21 LAB — POCT I-STAT, CHEM 8
BUN: 25 mg/dL — ABNORMAL HIGH (ref 6–20)
Calcium, Ion: 1.19 mmol/L (ref 1.15–1.40)
Chloride: 101 mmol/L (ref 101–111)
Creatinine, Ser: 1 mg/dL (ref 0.61–1.24)
Glucose, Bld: 242 mg/dL — ABNORMAL HIGH (ref 65–99)
HCT: 44 % (ref 39.0–52.0)
Hemoglobin: 15 g/dL (ref 13.0–17.0)
Potassium: 4.2 mmol/L (ref 3.5–5.1)
Sodium: 139 mmol/L (ref 135–145)
TCO2: 25 mmol/L (ref 0–100)

## 2016-01-21 LAB — GLUCOSE, CAPILLARY: Glucose-Capillary: 235 mg/dL — ABNORMAL HIGH (ref 65–99)

## 2016-01-21 SURGERY — CYSTOSCOPY, WITH BLADDER CALCULUS LITHOLAPAXY
Anesthesia: General | Site: Bladder

## 2016-01-21 MED ORDER — FENTANYL CITRATE (PF) 100 MCG/2ML IJ SOLN
INTRAMUSCULAR | Status: DC | PRN
  Administered 2016-01-21 (×4): 50 ug via INTRAVENOUS

## 2016-01-21 MED ORDER — PROPOFOL 10 MG/ML IV BOLUS
INTRAVENOUS | Status: DC | PRN
  Administered 2016-01-21: 200 mg via INTRAVENOUS
  Administered 2016-01-21 (×4): 100 mg via INTRAVENOUS

## 2016-01-21 MED ORDER — PHENAZOPYRIDINE HCL 200 MG PO TABS
200.0000 mg | ORAL_TABLET | Freq: Once | ORAL | Status: AC
  Administered 2016-01-21: 200 mg via ORAL
  Filled 2016-01-21: qty 1

## 2016-01-21 MED ORDER — CIPROFLOXACIN IN D5W 400 MG/200ML IV SOLN
INTRAVENOUS | Status: AC
  Filled 2016-01-21: qty 200

## 2016-01-21 MED ORDER — NEOSTIGMINE METHYLSULFATE 10 MG/10ML IV SOLN
INTRAVENOUS | Status: DC | PRN
  Administered 2016-01-21: 4 mg via INTRAVENOUS

## 2016-01-21 MED ORDER — FENTANYL CITRATE (PF) 100 MCG/2ML IJ SOLN
INTRAMUSCULAR | Status: AC
  Filled 2016-01-21: qty 2

## 2016-01-21 MED ORDER — KETOROLAC TROMETHAMINE 30 MG/ML IJ SOLN
INTRAMUSCULAR | Status: AC
  Filled 2016-01-21: qty 1

## 2016-01-21 MED ORDER — STERILE WATER FOR IRRIGATION IR SOLN
Status: DC | PRN
Start: 2016-01-21 — End: 2016-01-21
  Administered 2016-01-21 (×6): 3000 mL via INTRAVESICAL

## 2016-01-21 MED ORDER — LABETALOL HCL 5 MG/ML IV SOLN
INTRAVENOUS | Status: AC
  Filled 2016-01-21: qty 4

## 2016-01-21 MED ORDER — LIDOCAINE 2% (20 MG/ML) 5 ML SYRINGE
INTRAMUSCULAR | Status: AC
  Filled 2016-01-21: qty 5

## 2016-01-21 MED ORDER — FENTANYL CITRATE (PF) 100 MCG/2ML IJ SOLN
25.0000 ug | INTRAMUSCULAR | Status: DC | PRN
  Filled 2016-01-21: qty 1

## 2016-01-21 MED ORDER — METOCLOPRAMIDE HCL 5 MG/ML IJ SOLN
INTRAMUSCULAR | Status: DC | PRN
  Administered 2016-01-21: 10 mg via INTRAVENOUS

## 2016-01-21 MED ORDER — SODIUM CHLORIDE 0.9 % IR SOLN
Status: DC | PRN
  Administered 2016-01-21: 3000 mL

## 2016-01-21 MED ORDER — LIDOCAINE HCL (CARDIAC) 20 MG/ML IV SOLN
INTRAVENOUS | Status: DC | PRN
  Administered 2016-01-21: 100 mg via INTRAVENOUS

## 2016-01-21 MED ORDER — ONDANSETRON HCL 4 MG/2ML IJ SOLN
4.0000 mg | Freq: Once | INTRAMUSCULAR | Status: DC | PRN
  Filled 2016-01-21: qty 2

## 2016-01-21 MED ORDER — PHENAZOPYRIDINE HCL 200 MG PO TABS: 200.0000 mg | ORAL_TABLET | Freq: Three times a day (TID) | ORAL | 0 refills | Status: DC | PRN

## 2016-01-21 MED ORDER — LACTATED RINGERS IV SOLN
INTRAVENOUS | Status: DC
  Administered 2016-01-21 (×3): via INTRAVENOUS
  Filled 2016-01-21: qty 1000

## 2016-01-21 MED ORDER — CIPROFLOXACIN IN D5W 400 MG/200ML IV SOLN
400.0000 mg | INTRAVENOUS | Status: AC
  Administered 2016-01-21: 400 mg via INTRAVENOUS
  Filled 2016-01-21: qty 200

## 2016-01-21 MED ORDER — METOCLOPRAMIDE HCL 5 MG/ML IJ SOLN
INTRAMUSCULAR | Status: AC
  Filled 2016-01-21: qty 2

## 2016-01-21 MED ORDER — SUCCINYLCHOLINE CHLORIDE 20 MG/ML IJ SOLN
INTRAMUSCULAR | Status: DC | PRN
  Administered 2016-01-21: 120 mg via INTRAVENOUS

## 2016-01-21 MED ORDER — ONDANSETRON HCL 4 MG/2ML IJ SOLN
INTRAMUSCULAR | Status: DC | PRN
Start: 2016-01-21 — End: 2016-01-21
  Administered 2016-01-21: 4 mg via INTRAVENOUS

## 2016-01-21 MED ORDER — HYDROCODONE-ACETAMINOPHEN 10-325 MG PO TABS: 1.0000 | ORAL_TABLET | ORAL | 0 refills | Status: DC | PRN

## 2016-01-21 MED ORDER — LABETALOL HCL 5 MG/ML IV SOLN
INTRAVENOUS | Status: DC | PRN
  Administered 2016-01-21: 5 mg via INTRAVENOUS
  Administered 2016-01-21: 10 mg via INTRAVENOUS
  Administered 2016-01-21: 5 mg via INTRAVENOUS

## 2016-01-21 MED ORDER — ONDANSETRON HCL 4 MG/2ML IJ SOLN
INTRAMUSCULAR | Status: AC
  Filled 2016-01-21: qty 2

## 2016-01-21 MED ORDER — HYDROCODONE-ACETAMINOPHEN 5-325 MG PO TABS
1.0000 | ORAL_TABLET | Freq: Once | ORAL | Status: AC
  Administered 2016-01-21: 1 via ORAL
  Filled 2016-01-21: qty 1

## 2016-01-21 MED ORDER — KETOROLAC TROMETHAMINE 30 MG/ML IJ SOLN
INTRAMUSCULAR | Status: DC | PRN
  Administered 2016-01-21: 30 mg via INTRAVENOUS

## 2016-01-21 MED ORDER — ROCURONIUM BROMIDE 50 MG/5ML IV SOSY
PREFILLED_SYRINGE | INTRAVENOUS | Status: AC
  Filled 2016-01-21: qty 5

## 2016-01-21 MED ORDER — PHENAZOPYRIDINE HCL 100 MG PO TABS
ORAL_TABLET | ORAL | Status: AC
  Filled 2016-01-21: qty 2

## 2016-01-21 MED ORDER — PROPOFOL 10 MG/ML IV BOLUS
INTRAVENOUS | Status: AC
  Filled 2016-01-21: qty 20

## 2016-01-21 MED ORDER — HYDROCODONE-ACETAMINOPHEN 5-325 MG PO TABS
ORAL_TABLET | ORAL | Status: AC
  Filled 2016-01-21: qty 1

## 2016-01-21 MED ORDER — PROPOFOL 10 MG/ML IV BOLUS
INTRAVENOUS | Status: AC
  Filled 2016-01-21: qty 40

## 2016-01-21 MED ORDER — ROCURONIUM BROMIDE 100 MG/10ML IV SOLN
INTRAVENOUS | Status: DC | PRN
  Administered 2016-01-21: 10 mg via INTRAVENOUS
  Administered 2016-01-21: 30 mg via INTRAVENOUS
  Administered 2016-01-21: 10 mg via INTRAVENOUS

## 2016-01-21 MED ORDER — SUCCINYLCHOLINE CHLORIDE 200 MG/10ML IV SOSY
PREFILLED_SYRINGE | INTRAVENOUS | Status: AC
  Filled 2016-01-21: qty 10

## 2016-01-21 MED ORDER — GLYCOPYRROLATE 0.2 MG/ML IJ SOLN
INTRAMUSCULAR | Status: DC | PRN
  Administered 2016-01-21: 0.4 mg via INTRAVENOUS

## 2016-01-21 SURGICAL SUPPLY — 26 items
BAG DRAIN URO-CYSTO SKYTR STRL (DRAIN) ×2 IMPLANT
BAG URINE DRAINAGE (UROLOGICAL SUPPLIES) ×2 IMPLANT
BASKET LASER NITINOL 1.9FR (BASKET) IMPLANT
BASKET ZERO TIP NITINOL 2.4FR (BASKET) IMPLANT
CATH FOLEY 2WAY SLVR  5CC 18FR (CATHETERS) ×1
CATH FOLEY 2WAY SLVR 5CC 18FR (CATHETERS) ×1 IMPLANT
CLOTH BEACON ORANGE TIMEOUT ST (SAFETY) ×2 IMPLANT
ELECT BIVAP BIPO 22/24 DONUT (ELECTROSURGICAL) ×2
ELECT REM PT RETURN 9FT ADLT (ELECTROSURGICAL) ×2
ELECTRD BIVAP BIPO 22/24 DONUT (ELECTROSURGICAL) ×1 IMPLANT
ELECTRODE REM PT RTRN 9FT ADLT (ELECTROSURGICAL) ×1 IMPLANT
EVACUATOR MICROVAS BLADDER (UROLOGICAL SUPPLIES) ×2 IMPLANT
FIBER LASER FLEXIVA 1000 (UROLOGICAL SUPPLIES) ×2 IMPLANT
GLOVE BIO SURGEON STRL SZ8 (GLOVE) ×2 IMPLANT
GOWN STRL REUS W/ TWL LRG LVL3 (GOWN DISPOSABLE) ×1 IMPLANT
GOWN STRL REUS W/ TWL XL LVL3 (GOWN DISPOSABLE) ×1 IMPLANT
GOWN STRL REUS W/TWL LRG LVL3 (GOWN DISPOSABLE) ×1
GOWN STRL REUS W/TWL XL LVL3 (GOWN DISPOSABLE) ×1
HOLDER FOLEY CATH W/STRAP (MISCELLANEOUS) ×2 IMPLANT
IV NS IRRIG 3000ML ARTHROMATIC (IV SOLUTION) ×2 IMPLANT
KIT ROOM TURNOVER WOR (KITS) ×2 IMPLANT
MANIFOLD NEPTUNE II (INSTRUMENTS) ×2 IMPLANT
PACK CYSTO (CUSTOM PROCEDURE TRAY) ×2 IMPLANT
TUBE CONNECTING 12X1/4 (SUCTIONS) ×2 IMPLANT
WATER STERILE IRR 3000ML UROMA (IV SOLUTION) ×12 IMPLANT
WATER STERILE IRR 500ML POUR (IV SOLUTION) ×6 IMPLANT

## 2016-01-21 NOTE — Transfer of Care (Signed)
Immediate Anesthesia Transfer of Care Note  Patient: Kenneth Chen  Procedure(s) Performed: Procedure(s) (LRB): CYSTOSCOPY WITH LITHOLAPAXY (N/A) CYSTOSCOPY WITH BLADDER  BIOPSY (N/A) HOLMIUM LASER APPLICATION (N/A)  Patient Location: PACU  Anesthesia Type: General  Level of Consciousness: awake, sedated, patient cooperative and responds to stimulation  Airway & Oxygen Therapy: Patient Spontanous Breathing and Patient connected to face mask oxygen  Post-op Assessment: Report given to PACU RN, Post -op Vital signs reviewed and stable and Patient moving all extremities  Post vital signs: Reviewed and stable  Complications: No apparent anesthesia complications

## 2016-01-21 NOTE — Anesthesia Postprocedure Evaluation (Signed)
Anesthesia Post Note  Patient: Cary Brickhouse  Procedure(s) Performed: Procedure(s) (LRB): CYSTOSCOPY WITH LITHOLAPAXY (N/A) CYSTOSCOPY WITH BLADDER  BIOPSY (N/A) HOLMIUM LASER APPLICATION (N/A)  Patient location during evaluation: PACU Anesthesia Type: General Level of consciousness: awake and alert Pain management: pain level controlled Vital Signs Assessment: post-procedure vital signs reviewed and stable Respiratory status: spontaneous breathing, nonlabored ventilation and respiratory function stable Cardiovascular status: blood pressure returned to baseline and stable Postop Assessment: no signs of nausea or vomiting Anesthetic complications: no    Last Vitals:  Vitals:   01/21/16 1030 01/21/16 1045  BP: (!) 110/59 (!) 116/53  Pulse: 80 76  Resp: (!) 21 14  Temp:      Last Pain:  Vitals:   01/21/16 0626  TempSrc: Oral                 Nilda Simmer

## 2016-01-21 NOTE — Discharge Instructions (Addendum)

## 2016-01-21 NOTE — Op Note (Signed)
PATIENT:  Kenneth Chen  PRE-OPERATIVE DIAGNOSIS: 1. Multiple bladder calculi. 2. Bladder lesions  POST-OPERATIVE DIAGNOSIS: Same  PROCEDURE: 1. Cystolitholapaxy (5 cm) 2. Bladder biopsy  SURGEON:  Claybon Jabs  INDICATION: [name] is a 70 year old male who was found to have multiple, large bladder calculi noted at the time of workup for hematuria. Cystoscopically in addition to the stones there were areas on the bladder that appeared abnormal and I therefore recommended biopsy of these lesions and cystolitholapaxy.  ANESTHESIA:  General endotracheal  EBL:  50 mL  DRAINS: 18 French Foley catheter  LOCAL MEDICATIONS USED:  None  SPECIMEN:  1. Stone given to patient 2. Cold cup biopsies from the posterior wall, right wall and left wall to pathology  Description of procedure: After informed consent the patient was taken to the operating room and placed on the table in a supine position. General anesthesia was then administered. Once fully anesthetized the patient was moved to the dorsal lithotomy position and the genitalia were sterilely prepped and draped in standard fashion. An official timeout was then performed.  The 23 French rigid cystoscope was passed under direct vision down the urethra which was noted be normal. 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 The bladder was entered and I noted at least 5 large, round stones on the floor the bladder which were photographed. I then inspected the bladder and found areas of slightly erythematous mucosa located on the posterior wall, left wall and right walls. The ureteral orifices were noted to be of normal configuration and position.  The cold cup biopsy forceps were introduced and I obtained biopsies from the right wall, left wall and posterior wall. These were sent to pathology and the Bugbee electrode was then used to fulgurate the biopsy sites.  I  then introduced a 1000  holmium laser fiber and used this to fragment all of the stones. I used the [employer] and also a larger resectoscope sheath to irrigate all of the stone fragments out of the bladder. Reinspection revealed no further fragments however there was some oozing and so I reinserted the resectoscope with the button electrode and found some bleeding was occurring from the previously fulgurated biopsy sites. These were refulgurated and this controlled the bleeding. There was also some bleeding from the floor of the prostate which was fulgurated. I then removed the resectoscope and inserted the 18 French Foley catheter. Irrigation returned slightly pink and this was connected to closed system drainage. The patient was then awakened and taken to the recovery room in stable and satisfactory condition. He tolerated procedure well no intraoperative complications.    PLAN OF CARE: Discharge to home after PACU  PATIENT DISPOSITION:  PACU - hemodynamically stable.

## 2016-01-21 NOTE — Anesthesia Procedure Notes (Signed)
Procedure Name: Intubation Date/Time: 01/21/2016 7:44 AM Performed by: Justice Rocher Pre-anesthesia Checklist: Patient identified, Emergency Drugs available, Suction available and Patient being monitored Patient Re-evaluated:Patient Re-evaluated prior to inductionOxygen Delivery Method: Circle system utilized Preoxygenation: Pre-oxygenation with 100% oxygen Intubation Type: IV induction Ventilation: Mask ventilation without difficulty Laryngoscope Size: Mac and 4 Grade View: Grade II Tube type: Oral Tube size: 8.0 mm Number of attempts: 1 Airway Equipment and Method: Stylet and Oral airway Placement Confirmation: ETT inserted through vocal cords under direct vision,  positive ETCO2 and breath sounds checked- equal and bilateral Secured at: 24 cm Tube secured with: Tape Dental Injury: Teeth and Oropharynx as per pre-operative assessment  Comments: Noted small fragment of upper right tooth loose, poor condition, broken, discolored, LOOSE preop / partial removed by pt in preop holding

## 2016-01-21 NOTE — Anesthesia Procedure Notes (Signed)
Procedure Name: LMA Insertion Date/Time: 01/21/2016 7:42 AM Performed by: Justice Rocher Pre-anesthesia Checklist: Patient identified, Emergency Drugs available, Suction available and Patient being monitored Patient Re-evaluated:Patient Re-evaluated prior to inductionOxygen Delivery Method: Circle system utilized Preoxygenation: Pre-oxygenation with 100% oxygen Intubation Type: IV induction Ventilation: Mask ventilation without difficulty LMA: LMA inserted LMA Size: 4.0 Tube size: 5.0 mm Number of attempts: 2 Airway Equipment and Method: Bite block Placement Confirmation: positive ETCO2 Tube secured with: Tape Dental Injury: Teeth and Oropharynx as per pre-operative assessment  Comments: LMA placed difficult to seat after repositioning, noted upper right loose small fragment of tooth prior with LMA placement , removed LMA elected to intubate, SaO2 Stable ventilates easily .

## 2016-01-22 ENCOUNTER — Encounter (HOSPITAL_BASED_OUTPATIENT_CLINIC_OR_DEPARTMENT_OTHER): Payer: Self-pay | Admitting: Urology

## 2016-01-28 DIAGNOSIS — N210 Calculus in bladder: Secondary | ICD-10-CM | POA: Diagnosis not present

## 2016-05-21 DIAGNOSIS — L723 Sebaceous cyst: Secondary | ICD-10-CM | POA: Diagnosis not present

## 2016-05-21 DIAGNOSIS — D2271 Melanocytic nevi of right lower limb, including hip: Secondary | ICD-10-CM | POA: Diagnosis not present

## 2016-05-21 DIAGNOSIS — D225 Melanocytic nevi of trunk: Secondary | ICD-10-CM | POA: Diagnosis not present

## 2016-05-21 DIAGNOSIS — Z86018 Personal history of other benign neoplasm: Secondary | ICD-10-CM | POA: Diagnosis not present

## 2016-05-21 DIAGNOSIS — Z23 Encounter for immunization: Secondary | ICD-10-CM | POA: Diagnosis not present

## 2016-05-21 DIAGNOSIS — Z86008 Personal history of in-situ neoplasm of other site: Secondary | ICD-10-CM | POA: Diagnosis not present

## 2016-05-22 DIAGNOSIS — H40009 Preglaucoma, unspecified, unspecified eye: Secondary | ICD-10-CM | POA: Diagnosis not present

## 2016-05-22 DIAGNOSIS — E119 Type 2 diabetes mellitus without complications: Secondary | ICD-10-CM | POA: Diagnosis not present

## 2016-05-27 DIAGNOSIS — H40009 Preglaucoma, unspecified, unspecified eye: Secondary | ICD-10-CM | POA: Diagnosis not present

## 2016-05-27 DIAGNOSIS — E119 Type 2 diabetes mellitus without complications: Secondary | ICD-10-CM | POA: Diagnosis not present

## 2016-07-29 DIAGNOSIS — N210 Calculus in bladder: Secondary | ICD-10-CM | POA: Diagnosis not present

## 2016-07-29 DIAGNOSIS — Z125 Encounter for screening for malignant neoplasm of prostate: Secondary | ICD-10-CM | POA: Diagnosis not present

## 2016-11-26 DIAGNOSIS — E7800 Pure hypercholesterolemia, unspecified: Secondary | ICD-10-CM | POA: Diagnosis not present

## 2016-11-26 DIAGNOSIS — Z23 Encounter for immunization: Secondary | ICD-10-CM | POA: Diagnosis not present

## 2016-11-26 DIAGNOSIS — Z7984 Long term (current) use of oral hypoglycemic drugs: Secondary | ICD-10-CM | POA: Diagnosis not present

## 2016-11-26 DIAGNOSIS — J309 Allergic rhinitis, unspecified: Secondary | ICD-10-CM | POA: Diagnosis not present

## 2016-11-26 DIAGNOSIS — Z0000 Encounter for general adult medical examination without abnormal findings: Secondary | ICD-10-CM | POA: Diagnosis not present

## 2016-11-26 DIAGNOSIS — Z1211 Encounter for screening for malignant neoplasm of colon: Secondary | ICD-10-CM | POA: Diagnosis not present

## 2016-11-26 DIAGNOSIS — E1165 Type 2 diabetes mellitus with hyperglycemia: Secondary | ICD-10-CM | POA: Diagnosis not present

## 2016-12-05 DIAGNOSIS — D2271 Melanocytic nevi of right lower limb, including hip: Secondary | ICD-10-CM | POA: Diagnosis not present

## 2016-12-05 DIAGNOSIS — Z86008 Personal history of in-situ neoplasm of other site: Secondary | ICD-10-CM | POA: Diagnosis not present

## 2016-12-05 DIAGNOSIS — D225 Melanocytic nevi of trunk: Secondary | ICD-10-CM | POA: Diagnosis not present

## 2016-12-05 DIAGNOSIS — Z23 Encounter for immunization: Secondary | ICD-10-CM | POA: Diagnosis not present

## 2016-12-05 DIAGNOSIS — L570 Actinic keratosis: Secondary | ICD-10-CM | POA: Diagnosis not present

## 2016-12-05 DIAGNOSIS — Z86018 Personal history of other benign neoplasm: Secondary | ICD-10-CM | POA: Diagnosis not present

## 2016-12-15 DIAGNOSIS — Z1211 Encounter for screening for malignant neoplasm of colon: Secondary | ICD-10-CM | POA: Diagnosis not present

## 2017-05-26 DIAGNOSIS — E119 Type 2 diabetes mellitus without complications: Secondary | ICD-10-CM | POA: Diagnosis not present

## 2017-05-26 DIAGNOSIS — H40009 Preglaucoma, unspecified, unspecified eye: Secondary | ICD-10-CM | POA: Diagnosis not present

## 2017-07-01 DIAGNOSIS — Z86008 Personal history of in-situ neoplasm of other site: Secondary | ICD-10-CM | POA: Diagnosis not present

## 2017-07-01 DIAGNOSIS — D2271 Melanocytic nevi of right lower limb, including hip: Secondary | ICD-10-CM | POA: Diagnosis not present

## 2017-07-01 DIAGNOSIS — Z86018 Personal history of other benign neoplasm: Secondary | ICD-10-CM | POA: Diagnosis not present

## 2017-07-01 DIAGNOSIS — D225 Melanocytic nevi of trunk: Secondary | ICD-10-CM | POA: Diagnosis not present

## 2017-07-15 DIAGNOSIS — N400 Enlarged prostate without lower urinary tract symptoms: Secondary | ICD-10-CM | POA: Diagnosis not present

## 2017-07-15 DIAGNOSIS — N281 Cyst of kidney, acquired: Secondary | ICD-10-CM | POA: Diagnosis not present

## 2017-07-15 DIAGNOSIS — N210 Calculus in bladder: Secondary | ICD-10-CM | POA: Diagnosis not present

## 2017-07-15 DIAGNOSIS — Z125 Encounter for screening for malignant neoplasm of prostate: Secondary | ICD-10-CM | POA: Diagnosis not present

## 2017-07-24 DIAGNOSIS — N201 Calculus of ureter: Secondary | ICD-10-CM | POA: Diagnosis not present

## 2017-07-24 DIAGNOSIS — R1032 Left lower quadrant pain: Secondary | ICD-10-CM | POA: Diagnosis not present

## 2017-07-24 DIAGNOSIS — Z87442 Personal history of urinary calculi: Secondary | ICD-10-CM | POA: Diagnosis not present

## 2017-07-24 DIAGNOSIS — R109 Unspecified abdominal pain: Secondary | ICD-10-CM | POA: Diagnosis not present

## 2017-08-06 DIAGNOSIS — N202 Calculus of kidney with calculus of ureter: Secondary | ICD-10-CM | POA: Diagnosis not present

## 2017-09-17 DIAGNOSIS — Z125 Encounter for screening for malignant neoplasm of prostate: Secondary | ICD-10-CM | POA: Diagnosis not present

## 2017-09-17 DIAGNOSIS — E1165 Type 2 diabetes mellitus with hyperglycemia: Secondary | ICD-10-CM | POA: Diagnosis not present

## 2017-09-17 DIAGNOSIS — E7800 Pure hypercholesterolemia, unspecified: Secondary | ICD-10-CM | POA: Diagnosis not present

## 2017-11-17 DIAGNOSIS — Z23 Encounter for immunization: Secondary | ICD-10-CM | POA: Diagnosis not present

## 2017-12-08 DIAGNOSIS — E119 Type 2 diabetes mellitus without complications: Secondary | ICD-10-CM | POA: Diagnosis not present

## 2017-12-31 DIAGNOSIS — D2271 Melanocytic nevi of right lower limb, including hip: Secondary | ICD-10-CM | POA: Diagnosis not present

## 2017-12-31 DIAGNOSIS — D225 Melanocytic nevi of trunk: Secondary | ICD-10-CM | POA: Diagnosis not present

## 2017-12-31 DIAGNOSIS — Z86008 Personal history of in-situ neoplasm of other site: Secondary | ICD-10-CM | POA: Diagnosis not present

## 2017-12-31 DIAGNOSIS — Z86018 Personal history of other benign neoplasm: Secondary | ICD-10-CM | POA: Diagnosis not present

## 2018-03-12 DIAGNOSIS — E1165 Type 2 diabetes mellitus with hyperglycemia: Secondary | ICD-10-CM | POA: Diagnosis not present

## 2018-03-12 DIAGNOSIS — Z125 Encounter for screening for malignant neoplasm of prostate: Secondary | ICD-10-CM | POA: Diagnosis not present

## 2018-03-12 DIAGNOSIS — E7800 Pure hypercholesterolemia, unspecified: Secondary | ICD-10-CM | POA: Diagnosis not present

## 2018-03-16 DIAGNOSIS — R0981 Nasal congestion: Secondary | ICD-10-CM | POA: Diagnosis not present

## 2018-03-16 DIAGNOSIS — Z1389 Encounter for screening for other disorder: Secondary | ICD-10-CM | POA: Diagnosis not present

## 2018-03-16 DIAGNOSIS — E7800 Pure hypercholesterolemia, unspecified: Secondary | ICD-10-CM | POA: Diagnosis not present

## 2018-03-16 DIAGNOSIS — E1169 Type 2 diabetes mellitus with other specified complication: Secondary | ICD-10-CM | POA: Diagnosis not present

## 2018-03-16 DIAGNOSIS — R030 Elevated blood-pressure reading, without diagnosis of hypertension: Secondary | ICD-10-CM | POA: Diagnosis not present

## 2018-03-16 DIAGNOSIS — Z1159 Encounter for screening for other viral diseases: Secondary | ICD-10-CM | POA: Diagnosis not present

## 2018-03-16 DIAGNOSIS — Z0000 Encounter for general adult medical examination without abnormal findings: Secondary | ICD-10-CM | POA: Diagnosis not present

## 2018-03-16 DIAGNOSIS — Z1211 Encounter for screening for malignant neoplasm of colon: Secondary | ICD-10-CM | POA: Diagnosis not present

## 2018-03-18 DIAGNOSIS — Z1211 Encounter for screening for malignant neoplasm of colon: Secondary | ICD-10-CM | POA: Diagnosis not present

## 2018-05-04 DIAGNOSIS — R21 Rash and other nonspecific skin eruption: Secondary | ICD-10-CM | POA: Diagnosis not present

## 2018-09-14 DIAGNOSIS — E1169 Type 2 diabetes mellitus with other specified complication: Secondary | ICD-10-CM | POA: Diagnosis not present

## 2018-12-07 DIAGNOSIS — Z23 Encounter for immunization: Secondary | ICD-10-CM | POA: Diagnosis not present

## 2019-01-12 DIAGNOSIS — Z23 Encounter for immunization: Secondary | ICD-10-CM | POA: Diagnosis not present

## 2019-01-12 DIAGNOSIS — D2271 Melanocytic nevi of right lower limb, including hip: Secondary | ICD-10-CM | POA: Diagnosis not present

## 2019-01-12 DIAGNOSIS — D485 Neoplasm of uncertain behavior of skin: Secondary | ICD-10-CM | POA: Diagnosis not present

## 2019-01-12 DIAGNOSIS — D225 Melanocytic nevi of trunk: Secondary | ICD-10-CM | POA: Diagnosis not present

## 2019-01-12 DIAGNOSIS — Z86018 Personal history of other benign neoplasm: Secondary | ICD-10-CM | POA: Diagnosis not present

## 2019-01-12 DIAGNOSIS — Z86008 Personal history of in-situ neoplasm of other site: Secondary | ICD-10-CM | POA: Diagnosis not present

## 2019-04-19 DIAGNOSIS — E7800 Pure hypercholesterolemia, unspecified: Secondary | ICD-10-CM | POA: Diagnosis not present

## 2019-04-19 DIAGNOSIS — E1169 Type 2 diabetes mellitus with other specified complication: Secondary | ICD-10-CM | POA: Diagnosis not present

## 2019-04-19 DIAGNOSIS — Z125 Encounter for screening for malignant neoplasm of prostate: Secondary | ICD-10-CM | POA: Diagnosis not present

## 2019-04-19 DIAGNOSIS — J309 Allergic rhinitis, unspecified: Secondary | ICD-10-CM | POA: Diagnosis not present

## 2019-04-19 DIAGNOSIS — Z1159 Encounter for screening for other viral diseases: Secondary | ICD-10-CM | POA: Diagnosis not present

## 2019-04-19 DIAGNOSIS — Z0000 Encounter for general adult medical examination without abnormal findings: Secondary | ICD-10-CM | POA: Diagnosis not present

## 2019-05-06 DIAGNOSIS — Z0000 Encounter for general adult medical examination without abnormal findings: Secondary | ICD-10-CM | POA: Diagnosis not present

## 2019-05-06 DIAGNOSIS — Z125 Encounter for screening for malignant neoplasm of prostate: Secondary | ICD-10-CM | POA: Diagnosis not present

## 2019-05-06 DIAGNOSIS — J309 Allergic rhinitis, unspecified: Secondary | ICD-10-CM | POA: Diagnosis not present

## 2019-05-06 DIAGNOSIS — Z1159 Encounter for screening for other viral diseases: Secondary | ICD-10-CM | POA: Diagnosis not present

## 2019-05-06 DIAGNOSIS — E1169 Type 2 diabetes mellitus with other specified complication: Secondary | ICD-10-CM | POA: Diagnosis not present

## 2019-05-06 DIAGNOSIS — E7800 Pure hypercholesterolemia, unspecified: Secondary | ICD-10-CM | POA: Diagnosis not present

## 2019-06-28 DIAGNOSIS — N400 Enlarged prostate without lower urinary tract symptoms: Secondary | ICD-10-CM | POA: Diagnosis not present

## 2019-06-28 DIAGNOSIS — E7800 Pure hypercholesterolemia, unspecified: Secondary | ICD-10-CM | POA: Diagnosis not present

## 2019-06-28 DIAGNOSIS — Z7984 Long term (current) use of oral hypoglycemic drugs: Secondary | ICD-10-CM | POA: Diagnosis not present

## 2019-06-28 DIAGNOSIS — E1169 Type 2 diabetes mellitus with other specified complication: Secondary | ICD-10-CM | POA: Diagnosis not present

## 2019-06-28 DIAGNOSIS — N182 Chronic kidney disease, stage 2 (mild): Secondary | ICD-10-CM | POA: Diagnosis not present

## 2019-07-18 DIAGNOSIS — N400 Enlarged prostate without lower urinary tract symptoms: Secondary | ICD-10-CM | POA: Diagnosis not present

## 2019-07-18 DIAGNOSIS — E7800 Pure hypercholesterolemia, unspecified: Secondary | ICD-10-CM | POA: Diagnosis not present

## 2019-07-18 DIAGNOSIS — E1169 Type 2 diabetes mellitus with other specified complication: Secondary | ICD-10-CM | POA: Diagnosis not present

## 2019-07-18 DIAGNOSIS — N182 Chronic kidney disease, stage 2 (mild): Secondary | ICD-10-CM | POA: Diagnosis not present

## 2019-07-19 DIAGNOSIS — E1169 Type 2 diabetes mellitus with other specified complication: Secondary | ICD-10-CM | POA: Diagnosis not present

## 2019-07-19 DIAGNOSIS — J309 Allergic rhinitis, unspecified: Secondary | ICD-10-CM | POA: Diagnosis not present

## 2019-07-19 DIAGNOSIS — N182 Chronic kidney disease, stage 2 (mild): Secondary | ICD-10-CM | POA: Diagnosis not present

## 2019-07-19 DIAGNOSIS — N400 Enlarged prostate without lower urinary tract symptoms: Secondary | ICD-10-CM | POA: Diagnosis not present

## 2019-07-19 DIAGNOSIS — E7800 Pure hypercholesterolemia, unspecified: Secondary | ICD-10-CM | POA: Diagnosis not present

## 2019-08-16 ENCOUNTER — Other Ambulatory Visit: Payer: Self-pay | Admitting: Urology

## 2019-08-17 NOTE — Progress Notes (Signed)
DUE TO COVID-19 ONLY ONE VISITOR IS ALLOWED TO COME WITH YOU AND STAY IN THE WAITING ROOM ONLY DURING PRE OP AND PROCEDURE DAY OF SURGERY. THE 1 VISITOR MAY VISIT WITH YOU AFTER SURGERY IN YOUR PRIVATE ROOM DURING VISITING HOURS ONLY!  YOU NEED TO HAVE A COVID 19 TEST ON_______ @_______ , THIS TEST MUST BE DONE BEFORE SURGERY, COME  [place] , [id number].  ([place]) ONCE YOUR COVID TEST IS COMPLETED, PLEASE BEGIN THE QUARANTINE INSTRUCTIONS AS OUTLINED IN YOUR HANDOUT.                Kenneth Chen  08/17/2019   Your procedure is scheduled on:  08/30/2019   Report to [facility] Main  Entrance   Report to admitting at     12noon    Call this number if you have problems the morning of surgery [phone]    Remember: Do not eat food    :After Midnight. BRUSH YOUR TEETH MORNING OF SURGERY AND RINSE YOUR MOUTH OUT, NO CHEWING GUM CANDY OR MINTS.  May have cleasr liquids from 28midnite until 1100am morning of surgeyr then nothing by mouth.     Take these medicines the morning of surgery with A SIP OF WATER:  Pyridium if needed How to Manage Your Diabetes Before and After Surgery  Why is it important to control my blood sugar before and after surgery? . Improving blood sugar levels before and after surgery helps healing and can limit problems. . A way of improving blood sugar control is eating a healthy diet by: o  Eating less sugar and carbohydrates o  Increasing activity/exercise o  Talking with your doctor about reaching your blood sugar goals . High blood sugars (greater than 180 mg/dL) can raise your risk of infections and slow your recovery, so you will need to focus on controlling your diabetes during the weeks before surgery. . Make sure that the doctor who takes care of your diabetes knows about your planned surgery including the date and location.  How do I manage my blood sugar before surgery? . Check your blood sugar at least 4  times a day, starting 2 days before surgery, to make sure that the level is not too high or low. o Check your blood sugar the morning of your surgery when you wake up and every 2 hours until you get to the Short Stay unit. . If your blood sugar is less than 70 mg/dL, you will need to treat for low blood sugar: o Do not take insulin. o Treat a low blood sugar (less than 70 mg/dL) with  cup of clear juice (cranberry or apple), 4 glucose tablets, OR glucose gel. o Recheck blood sugar in 15 minutes after treatment (to make sure it is greater than 70 mg/dL). If your blood sugar is not greater than 70 mg/dL on recheck, call [phone] for further instructions. . Report your blood sugar to the short stay nurse when you get to Short Stay.  . If you are admitted to the hospital after surgery: o Your blood sugar will be checked by the staff and you will probably be given insulin after surgery (instead of oral diabetes medicines) to make sure you have good blood sugar levels. o The goal for blood sugar control after surgery is 80-180 mg/dL.   WHAT DO I DO ABOUT MY DIABETES MEDICATION?  Marland Kitchen Do not take oral diabetes medicines (pills) the morning of surgery.  . THE NIGHT  BEFORE SURGERY, take     units of       insulin.       . THE MORNING OF SURGERY, take   units of         insulin.  . The day of surgery, do not take other diabetes injectables, including Byetta (exenatide), Bydureon (exenatide ER), Victoza (liraglutide), or Trulicity (dulaglutide).  . If your CBG is greater than 220 mg/dL, you may take  of your sliding scale  . (correction) dose of insulin.    For patients with insulin pumps: Contact your diabetes doctor for specific instructions before surgery. Decrease basal rates by 20% at midnight the night before your surgery. Note that if your surgery is planned to be longer than 2 hours, your insulin pump will be removed and intravenous (IV) insulin will be started and managed by the nurses  and the anesthesiologist. You will be able to restart your insulin pump once you are awake and able to manage it.  Make sure to bring insulin pump supplies to the hospital with you in case the  site needs to be changed.  Patient Signature:  Date:   Nurse Signature:  Date:   Reviewed and Endorsed by [facility] Patient Education Committee, August 2015 DO NOT TAKE ANY DIABETIC MEDICATIONS DAY OF YOUR SURGERY                               You may not have any metal on your body including hair pins and              piercings  Do not wear jewelry, make-up, lotions, powders or perfumes, deodorant             Do not wear nail polish on your fingernails.  Do not shave  48 hours prior to surgery.              Men may shave face and neck.   Do not bring valuables to the hospital. [place].  Contacts, dentures or bridgework may not be worn into surgery.  Leave suitcase in the car. After surgery it may be brought to your room.     Patients discharged the day of surgery will not be allowed to drive home. IF YOU ARE HAVING SURGERY AND GOING HOME THE SAME DAY, YOU MUST HAVE AN ADULT TO DRIVE YOU HOME AND BE WITH YOU FOR 24 HOURS. YOU MAY GO HOME BY TAXI OR UBER OR ORTHERWISE, BUT AN ADULT MUST ACCOMPANY YOU HOME AND STAY WITH YOU FOR 24 HOURS.  Name and phone number of your driver:  Special Instructions: N/A              Please read over the following fact sheets you were given: _____________________________________________________________________                CLEAR LIQUID DIET   Foods Allowed                                                                     Foods Excluded  Coffee and tea, regular  and decaf                             liquids that you cannot  Plain Jell-O any favor except red or purple                                           see through such as: Fruit ices (not with fruit pulp)                                      milk, soups, orange juice  Iced Popsicles                                    All solid food Carbonated beverages, regular and diet                                    Cranberry, grape and apple juices Sports drinks like Gatorade Lightly seasoned clear broth or consume(fat free) Sugar, honey syrup  Sample Menu Breakfast                                Lunch                                     Supper Cranberry juice                    Beef broth                            Chicken broth Jell-O                                     Grape juice                           Apple juice Coffee or tea                        Jell-O                                      Popsicle                                                Coffee or tea                        Coffee or tea  _____________________________________________________________________  [facility] - Preparing for Surgery Before surgery, you can play an important role.  Because skin is not sterile, your skin needs to be as free of  germs as possible.  You can reduce the number of germs on your skin by washing with CHG (chlorahexidine gluconate) soap before surgery.  CHG is an antiseptic cleaner which kills germs and bonds with the skin to continue killing germs even after washing. Please DO NOT use if you have an allergy to CHG or antibacterial soaps.  If your skin becomes reddened/irritated stop using the CHG and inform your nurse when you arrive at Short Stay. Do not shave (including legs and underarms) for at least 48 hours prior to the first CHG shower.  You may shave your face/neck. Please follow these instructions carefully:  1.  Shower with CHG Soap the night before surgery and the  morning of Surgery.  2.  If you choose to wash your hair, wash your hair first as usual with your  normal  shampoo.  3.  After you shampoo, rinse your hair and body thoroughly to remove the  shampoo.                           4.  Use CHG as you would any other liquid  soap.  You can apply chg directly  to the skin and wash                       Gently with a scrungie or clean washcloth.  5.  Apply the CHG Soap to your body ONLY FROM THE NECK DOWN.   Do not use on face/ open                           Wound or open sores. Avoid contact with eyes, ears mouth and genitals (private parts).                       Wash face,  Genitals (private parts) with your normal soap.             6.  Wash thoroughly, paying special attention to the area where your surgery  will be performed.  7.  Thoroughly rinse your body with warm water from the neck down.  8.  DO NOT shower/wash with your normal soap after using and rinsing off  the CHG Soap.                9.  Pat yourself dry with a clean towel.            10.  Wear clean pajamas.            11.  Place clean sheets on your bed the night of your first shower and do not  sleep with pets. Day of Surgery : Do not apply any lotions/deodorants the morning of surgery.  Please wear clean clothes to the hospital/surgery center.  FAILURE TO FOLLOW THESE INSTRUCTIONS MAY RESULT IN THE CANCELLATION OF YOUR SURGERY PATIENT SIGNATURE_________________________________  NURSE SIGNATURE__________________________________  ________________________________________________________________________

## 2019-08-24 ENCOUNTER — Encounter (HOSPITAL_COMMUNITY)
Admission: RE | Admit: 2019-08-24 | Discharge: 2019-08-24 | Disposition: A | Discharge status: Home / Self Care | Payer: Medicare Other | Source: Ambulatory Visit | Attending: Urology | Admitting: Urology

## 2019-08-24 ENCOUNTER — Encounter (HOSPITAL_COMMUNITY): Admission: RE | Admit: 2019-08-24 | Payer: Medicare Other | Source: Ambulatory Visit

## 2019-08-24 ENCOUNTER — Encounter (HOSPITAL_COMMUNITY): Payer: Self-pay

## 2019-08-24 ENCOUNTER — Other Ambulatory Visit: Payer: Self-pay

## 2019-08-24 DIAGNOSIS — Z01818 Encounter for other preprocedural examination: Secondary | ICD-10-CM | POA: Insufficient documentation

## 2019-08-24 HISTORY — DX: Benign prostatic hyperplasia without lower urinary tract symptoms: N40.0

## 2019-08-24 HISTORY — DX: Personal history of other diseases of the nervous system and sense organs: Z86.69

## 2019-08-24 LAB — CBC
HCT: 44 % (ref 39.0–52.0)
Hemoglobin: 14 g/dL (ref 13.0–17.0)
MCH: 30.4 pg (ref 26.0–34.0)
MCHC: 31.8 g/dL (ref 30.0–36.0)
MCV: 95.4 fL (ref 80.0–100.0)
Platelets: 291 10*3/uL (ref 150–400)
RBC: 4.61 MIL/uL (ref 4.22–5.81)
RDW: 14 % (ref 11.5–15.5)
WBC: 9.4 10*3/uL (ref 4.0–10.5)
nRBC: 0 % (ref 0.0–0.2)

## 2019-08-24 LAB — BASIC METABOLIC PANEL
Anion gap: 10 (ref 5–15)
BUN: 32 mg/dL — ABNORMAL HIGH (ref 8–23)
CO2: 26 mmol/L (ref 22–32)
Calcium: 9.3 mg/dL (ref 8.9–10.3)
Chloride: 107 mmol/L (ref 98–111)
Creatinine, Ser: 1.34 mg/dL — ABNORMAL HIGH (ref 0.61–1.24)
GFR calc Af Amer: 59 mL/min — ABNORMAL LOW (ref >60)
GFR calc non Af Amer: 51 mL/min — ABNORMAL LOW (ref >60)
Glucose, Bld: 120 mg/dL — ABNORMAL HIGH (ref 70–99)
Potassium: 5.4 mmol/L — ABNORMAL HIGH (ref 3.5–5.1)
Sodium: 143 mmol/L (ref 135–145)

## 2019-08-24 LAB — HEMOGLOBIN A1C
Hgb A1c MFr Bld: 7.2 % — ABNORMAL HIGH (ref 4.8–5.6)
Mean Plasma Glucose: 159.94 mg/dL

## 2019-08-24 LAB — GLUCOSE, CAPILLARY: Glucose-Capillary: 113 mg/dL — ABNORMAL HIGH (ref 70–99)

## 2019-08-24 NOTE — Patient Instructions (Signed)
DUE TO COVID-19 ONLY ONE VISITOR ARE ALLOWED TO COME WITH YOU AND STAY IN THE WAITING ROOM ONLY DURING PRE OP AND PROCEDURE. THEN TWO VISITORS MAY VISIT WITH YOU IN YOUR PRIVATE ROOM DURING VISITING HOURS ONLY!!   COVID SWAB TESTING MUST BE COMPLETED ON: Friday, August 26, 2019 at 12:55PM    [place] Entr[facility] [place] (Must self quarantine after testing. Follow instructions on handout.)       Your procedure is scheduled on: Tuesday, August 30, 2019   Report to [facility] Main  Entrance    Report to admitting at 12 PM   Call this number if you have problems the morning of surgery [phone]   Do not eat food:After Midnight.   May have liquids until 11:00 AM day of surgery   CLEAR LIQUID DIET  Foods Allowed                                                                     Foods Excluded  Water, Black Coffee and tea, regular and decaf                             liquids that you cannot  Plain Jell-O in any flavor  (No red)                                           see through such as: Fruit ices (not with fruit pulp)                                     milk, soups, orange juice  Iced Popsicles (No red)                                    All solid food                                   Apple juices Sports drinks like Gatorade (No red) Lightly seasoned clear broth or consume(fat free) Sugar, honey syrup  Sample Menu Breakfast                                Lunch                                     Supper Cranberry juice                    Beef broth                            Chicken broth Jell-O  Grape juice                           Apple juice Coffee or tea                        Jell-O                                      Popsicle                                                Coffee or tea                        Coffee or tea   Oral Hygiene is also important to reduce your risk of  infection.                                    Remember - BRUSH YOUR TEETH THE MORNING OF SURGERY WITH YOUR REGULAR TOOTHPASTE   Do NOT smoke after Midnight   Take these medicines the morning of surgery with A SIP OF WATER: None  DO NOT TAKE ANY ORAL DIABETIC MEDICATIONS DAY OF YOUR SURGERY                               You may not have any metal on your body including jewelry, and body piercings             Do not wear lotions, powders, perfumes/cologne, or deodorant                          Men may shave face and neck.   Do not bring valuables to the hospital. [place].   Contacts, dentures or bridgework may not be worn into surgery.   Bring small overnight bag day of surgery.    Patients discharged the day of surgery will not be allowed to drive home.   Special Instructions: Bring a copy of your healthcare power of attorney and living will documents         the day of surgery if you haven't scanned them in before.              Please read over the following fact sheets you were given: IF YOU HAVE QUESTIONS ABOUT YOUR PRE OP [place] [phone]   How to Manage Your Diabetes Before and After Surgery  Why is it important to control my blood sugar before and after surgery? . Improving blood sugar levels before and after surgery helps healing and can limit problems. . A way of improving blood sugar control is eating a healthy diet by: o  Eating less sugar and carbohydrates o  Increasing activity/exercise o  Talking with your doctor about reaching your blood sugar goals . High blood sugars (greater than 180 mg/dL) can raise your risk of infections and slow your recovery, so you will need to  focus on controlling your diabetes during the weeks before surgery. . Make sure that the doctor who takes care of your diabetes knows about your planned surgery including the date and location.  How do I manage my blood sugar  before surgery? . Check your blood sugar at least 4 times a day, starting 2 days before surgery, to make sure that the level is not too high or low. o Check your blood sugar the morning of your surgery when you wake up and every 2 hours until you get to the Short Stay unit. . If your blood sugar is less than 70 mg/dL, you will need to treat for low blood sugar: o Do not take insulin. o Treat a low blood sugar (less than 70 mg/dL) with  cup of clear juice (cranberry or apple), 4 glucose tablets, OR glucose gel. o Recheck blood sugar in 15 minutes after treatment (to make sure it is greater than 70 mg/dL). If your blood sugar is not greater than 70 mg/dL on recheck, call [phone] for further instructions. . Report your blood sugar to the short stay nurse when you get to Short Stay.  . If you are admitted to the hospital after surgery: o Your blood sugar will be checked by the staff and you will probably be given insulin after surgery (instead of oral diabetes medicines) to make sure you have good blood sugar levels. o The goal for blood sugar control after surgery is 80-180 mg/dL.   WHAT DO I DO ABOUT MY DIABETES MEDICATION?  Marland Kitchen Do not take oral diabetes medicines (pills) the morning of surgery.  Reviewed and Endorsed by [facility] Patient Education Committee, August 2015  [facility] - Preparing for Surgery Before surgery, you can play an important role.  Because skin is not sterile, your skin needs to be as free of germs as possible.  You can reduce the number of germs on your skin by washing with CHG (chlorahexidine gluconate) soap before surgery.  CHG is an antiseptic cleaner which kills germs and bonds with the skin to continue killing germs even after washing. Please DO NOT use if you have an allergy to CHG or antibacterial soaps.  If your skin becomes reddened/irritated stop using the CHG and inform your nurse when you arrive at Short Stay. Do not shave (including legs and underarms)  for at least 48 hours prior to the first CHG shower.  You may shave your face/neck.  Please follow these instructions carefully:  1.  Shower with CHG Soap the night before surgery and the  morning of surgery.  2.  If you choose to wash your hair, wash your hair first as usual with your normal  shampoo.  3.  After you shampoo, rinse your hair and body thoroughly to remove the shampoo.                             4.  Use CHG as you would any other liquid soap.  You can apply chg directly to the skin and wash.  Gently with a scrungie or clean washcloth.  5.  Apply the CHG Soap to your body ONLY FROM THE NECK DOWN.   Do   not use on face/ open                           Wound or open sores. Avoid contact with eyes, ears mouth and  genitals (private parts).                       Wash face,  Genitals (private parts) with your normal soap.             6.  Wash thoroughly, paying special attention to the area where your    surgery  will be performed.  7.  Thoroughly rinse your body with warm water from the neck down.  8.  DO NOT shower/wash with your normal soap after using and rinsing off the CHG Soap.                9.  Pat yourself dry with a clean towel.            10.  Wear clean pajamas.            11.  Place clean sheets on your bed the night of your first shower and do not  sleep with pets. Day of Surgery : Do not apply any lotions/deodorants the morning of surgery.  Please wear clean clothes to the hospital/surgery center.  FAILURE TO FOLLOW THESE INSTRUCTIONS MAY RESULT IN THE CANCELLATION OF YOUR SURGERY  PATIENT SIGNATURE_________________________________  NURSE SIGNATURE__________________________________  ________________________________________________________________________

## 2019-08-24 NOTE — Progress Notes (Addendum)
COVID Vaccine Completed: Yes Date COVID Vaccine completed: 03/29/2019 COVID vaccine manufacturer: Pfizer    PCP - Dr. Cristi Loron Cardiologist - N/A  Chest x-ray - N/A EKG - N/A Stress Test - N/A ECHO - N/A Cardiac Cath - N/A  Sleep Study - Yes CPAP - Yes  Fasting Blood Sugar - below 130 Checks Blood Sugar __2__ times a week  Blood Thinner Instructions: Aspirin Instructions: Yes Last Dose: 08/15/2019 per patient  Anesthesia review: N/A  Patient denies shortness of breath, fever, cough and chest pain at PAT appointment   Patient verbalized understanding of instructions that were given to them at the PAT appointment. Patient was also instructed that they will need to review over the PAT instructions again at home before surgery.

## 2019-08-25 NOTE — H&P (Signed)
CC/HPI: cc: kidney stone pain   08/15/19: 74 year old man with a history of urolithiasis status post cystolitholapaxy in 2017 for bladder calculi. Patient remains on Flomax for lower urinary tract symptoms. He has passed approximately 5 stones in his lifetime. He has never needed surgery for ureteral calculi. The last kidney stone he passed was approximately 2 years ago. A CT scan done in [place] showed a 6 mm calculus that he passed 3 days later. Patient developed left lower quadrant pain at 9:00 p.m. on Saturday night associated with some nausea. He has been taking tamsulosin, tramadol and Zofran. He denies any fevers or chills. He is tolerating p.o. intake.     ALLERGIES: None   MEDICATIONS: Aspirin  Crestor 5 mg tablet  Flomax 0.4 mg capsule TAKE ONE CAPSULE EVERY DAY AT BEDTIME  Metformin Hcl 500 mg tablet  Tamsulosin Hcl 0.4 mg capsule TAKE ONE CAPSULE EVERY DAY AT BEDTIME  Allegra Allergy  Glimepiride 2 mg tablet  Ibuprofen 600 mg tablet  Multivitamin  Ondansetron Odt 4 mg tablet,disintegrating  Rosuvastatin Calcium 5 mg tablet  Tamulosin     GU PSH: Cysto Bladder Stone >2.5cm - 2017 Cysto Bladder Ureth Biopsy - 2017 Cystoscopy - 2017 Locm 300-399Mg /Ml Iodine,1Ml - 2017     NON-GU PSH: None   GU PMH: Renal calculus (Stable), Left, It appears he did have a left renal calculus when I saw him last. He did obtain a CT scan so I will have the results sent so I can review them and make sure there are no other stones noted - 2019 Ureteral calculus (Improving), Left, It appears he passed his left ureteral stone. At this point he should be stone free and I will confirm that is the case by obtaining his CT scan results. 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 - 2019 Encounter for Prostate Cancer screening - 2018 Microscopic hematuria (Stable), Microscopic hematuria is most likely due to bladder calculi. He did however have several areas on the lining of his bladder that appeared almost papillary and will need to be biopsied as they could be TCCa versus irritation from the presence of his bladder calculi. - 2017, I have discussed with the patient that the evaluation of asymptomatic microscopic hematuria consists of excluding benign causes including menstruation in women as well as, in both sexes, vigorous exercise, sexual activity, viral illnesses, trauma and infection. If these factors are negative and the urinalysis reveals significant proteinuria, dysmorphic red blood cells or red cell casts and/or an elevated serum creatinine then evaluation by a nephrologist is indicated. If conditions suggestive of primary renal disease are not present and the patient has a low risk of urothelial malignancy consisting of age less than 40 years, no smoking history, no history of chemical exposure, no irritative voiding symptoms, no history of gross hematuria and no history of urologic disorder or disease then upper tract imaging should be undertaken with a CT scan and consideration of cystoscopy versus urine cytology as well. If, on the other hand the patient is at high risk then a complete evaluation should be undertaken including upper tract imaging with CT scan and cystoscopy and consideration of urine cytology as well. , - 2017 BPH w/LUTS Renal cyst, Bilateral    NON-GU PMH: Diabetes Type 2  Hypercholesterolemia Sleep Apnea    FAMILY HISTORY: Diabetes - Brother, Mother, Uncle   SOCIAL HISTORY: Marital Status: Married Preferred Language: English; Ethnicity: Not Hispanic Or Latino; Race: White Current Smoking Status: Patient smokes occasionally.  Has never drank.  Does not drink caffeine.    REVIEW OF SYSTEMS:    GU Review Male:   Patient reports  frequent urination and get up at night to urinate. Patient denies hard to postpone urination, burning/ pain with urination, leakage of urine, stream starts and stops, trouble starting your stream, have to strain to urinate , erection problems, and penile pain.  Gastrointestinal (Upper):   Patient reports nausea. Patient denies vomiting and indigestion/ heartburn.  Gastrointestinal (Lower):   Patient reports constipation. Patient denies diarrhea.  Constitutional:   Patient denies fever, night sweats, weight loss, and fatigue.  Skin:   Patient denies skin rash/ lesion and itching.  Eyes:   Patient denies double vision and blurred vision.  Ears/ Nose/ Throat:   Patient denies sore throat and sinus problems.  Hematologic/Lymphatic:   Patient denies swollen glands and easy bruising.  Cardiovascular:   Patient denies leg swelling and chest pains.  Respiratory:   Patient denies cough and shortness of breath.  Endocrine:   Patient denies excessive thirst.  Musculoskeletal:   Patient denies back pain and joint pain.  Neurological:   Patient denies headaches and dizziness.  Psychologic:   Patient denies depression and anxiety.   VITAL SIGNS:      08/15/2019 01:47 PM  Weight 240 lb / 108.86 kg  Height 70 in / 177.8 cm  BP 124/79 mmHg  Pulse 93 /min  Temperature 97.3 F / 36.2 C  BMI 34.4 kg/m   MULTI-SYSTEM PHYSICAL EXAMINATION:    Constitutional: Well-nourished. No physical deformities. Normally developed. Good grooming.  Neck: Neck symmetrical, not swollen. Normal tracheal position.  Respiratory: No labored breathing, no use of accessory muscles.   Cardiovascular: Normal temperature  Skin: No paleness, no jaundice, no cyanosis. No lesion, no ulcer, no rash.  Neurologic / Psychiatric: Oriented to time, oriented to place, oriented to person. No depression, no anxiety, no agitation.  Gastrointestinal: No mass, no tenderness, no rigidity, non obese abdomen. No CVA tenderness bilaterally.  Eyes:  Normal conjunctivae. Normal eyelids.  Ears, Nose, Mouth, and Throat: Left ear no scars, no lesions, no masses. Right ear no scars, no lesions, no masses. Nose no scars, no lesions, no masses. Normal hearing. Normal lips.  Musculoskeletal: Normal gait and station of head and neck.     Complexity of Data:  Records Review:   Previous Doctor Records, POC Tool  Urine Test Review:   Urinalysis  X-Ray Review: C.T. Abdomen/Pelvis: Reviewed Films. Discussed With Patient. 3 cm bladder calculus, 5 mm left distal ureteral calculus with moderate hydronephrosis    07/29/16 10/16/15 09/25/14 09/09/13  PSA  Total PSA 2.24 ng/dl 2.21 ng/dl 2.21 ng/dl 2.26 ng/dl   Notes:                     07/19/2019: BUN 31, creatinine 1.22  05/06/2019: PSA 2.64   PROCEDURES:         C.T. ABD-Pelv w/o - 09233      Patient confirmed No Neulasta OnPro Device.         Urinalysis w/Scope Dipstick Dipstick Cont'd Micro  Color: Yellow Bilirubin: Neg mg/dL WBC/hpf: 0 - 5/hpf  Appearance: Slightly Cloudy Ketones: Neg mg/dL RBC/hpf: 10 - 20/hpf  Specific Gravity: 1.020 Blood: 3+ ery/uL Bacteria: Mod (  26-50/hpf)  pH: <=5.0 Protein: Trace mg/dL Cystals: NS (Not Seen)  Glucose: Neg mg/dL Urobilinogen: 0.2 mg/dL Casts: Hyaline    Nitrites: Neg Trichomonas: Not Present    Leukocyte Esterase: Trace leu/uL Mucous: Not Present      Epithelial Cells: 0 - 5/hpf      Yeast: NS (Not Seen)      Sperm: Not Present    ASSESSMENT:      ICD-10 Details  1 GU:   Ureteral calculus - N20.1 Left, Acute, Uncomplicated - Discussed CT scan results which show a left distal ureteral calculus with moderate hydronephrosis as well as a very large 3 cm bladder calculus. Discussed that with recurrent bladder calculi patient is likely not emptying his bladder well and may need resection of the prostate at the same time as cystolitholapaxy and left ureteroscopy with laser lithotripsy and stent placement discussed the risks and benefits of the above  procedure including bleeding, infection, need for overnight stay with Foley catheterization, pain, stent discomfort, need for staged procedure, damage to surrounding structures, retrograde ejaculation. I will give patient prescription for Keflex today as there is bacteria in his urine however does not look to be overtly infected. Patient was given return precautions including if he develops fevers and chills for urgent stent placement. Patient will continue Flomax and tramadol and see if he might pass this left ureteral calculus.  2   Bladder Stone - N21.0 Undiagnosed New Problem  3   BPH w/LUTS - N40.1 Chronic, Stable

## 2019-08-26 ENCOUNTER — Other Ambulatory Visit
Admission: RE | Admit: 2019-08-26 | Discharge: 2019-08-26 | Disposition: A | Discharge status: Home / Self Care | Payer: Medicare Other | Source: Ambulatory Visit | Attending: Urology | Admitting: Urology

## 2019-08-26 ENCOUNTER — Other Ambulatory Visit: Payer: Self-pay

## 2019-08-26 DIAGNOSIS — Z01812 Encounter for preprocedural laboratory examination: Secondary | ICD-10-CM | POA: Insufficient documentation

## 2019-08-26 DIAGNOSIS — Z20822 Contact with and (suspected) exposure to covid-19: Secondary | ICD-10-CM | POA: Diagnosis not present

## 2019-08-27 LAB — SARS CORONAVIRUS 2 (TAT 6-24 HRS): SARS Coronavirus 2: NEGATIVE

## 2019-08-30 ENCOUNTER — Encounter (HOSPITAL_COMMUNITY): Payer: Self-pay | Admitting: Urology

## 2019-08-30 ENCOUNTER — Ambulatory Visit (HOSPITAL_COMMUNITY): Payer: Medicare Other | Admitting: Anesthesiology

## 2019-08-30 ENCOUNTER — Other Ambulatory Visit: Payer: Self-pay

## 2019-08-30 ENCOUNTER — Observation Stay (HOSPITAL_COMMUNITY)
Admission: RE | Admit: 2019-08-30 | Discharge: 2019-08-31 | Disposition: A | Discharge status: Home / Self Care | Payer: Medicare Other | Attending: Urology | Admitting: Urology

## 2019-08-30 ENCOUNTER — Encounter (HOSPITAL_COMMUNITY)
Admission: RE | Disposition: A | Discharge status: Home / Self Care | Payer: Self-pay | Source: Home / Self Care | Attending: Urology

## 2019-08-30 ENCOUNTER — Ambulatory Visit (HOSPITAL_COMMUNITY): Payer: Medicare Other

## 2019-08-30 DIAGNOSIS — Z79899 Other long term (current) drug therapy: Secondary | ICD-10-CM | POA: Insufficient documentation

## 2019-08-30 DIAGNOSIS — N401 Enlarged prostate with lower urinary tract symptoms: Secondary | ICD-10-CM | POA: Diagnosis present

## 2019-08-30 DIAGNOSIS — F17200 Nicotine dependence, unspecified, uncomplicated: Secondary | ICD-10-CM | POA: Diagnosis not present

## 2019-08-30 DIAGNOSIS — Z87442 Personal history of urinary calculi: Secondary | ICD-10-CM | POA: Diagnosis not present

## 2019-08-30 DIAGNOSIS — N400 Enlarged prostate without lower urinary tract symptoms: Secondary | ICD-10-CM | POA: Diagnosis not present

## 2019-08-30 DIAGNOSIS — G4730 Sleep apnea, unspecified: Secondary | ICD-10-CM | POA: Insufficient documentation

## 2019-08-30 DIAGNOSIS — Z7984 Long term (current) use of oral hypoglycemic drugs: Secondary | ICD-10-CM | POA: Diagnosis not present

## 2019-08-30 DIAGNOSIS — E7800 Pure hypercholesterolemia, unspecified: Secondary | ICD-10-CM | POA: Insufficient documentation

## 2019-08-30 DIAGNOSIS — E119 Type 2 diabetes mellitus without complications: Secondary | ICD-10-CM | POA: Insufficient documentation

## 2019-08-30 DIAGNOSIS — Z7982 Long term (current) use of aspirin: Secondary | ICD-10-CM | POA: Insufficient documentation

## 2019-08-30 DIAGNOSIS — N138 Other obstructive and reflux uropathy: Secondary | ICD-10-CM | POA: Diagnosis not present

## 2019-08-30 DIAGNOSIS — N210 Calculus in bladder: Principal | ICD-10-CM | POA: Insufficient documentation

## 2019-08-30 DIAGNOSIS — N201 Calculus of ureter: Secondary | ICD-10-CM | POA: Diagnosis present

## 2019-08-30 HISTORY — PX: TRANSURETHRAL RESECTION OF PROSTATE: SHX73

## 2019-08-30 HISTORY — PX: HOLMIUM LASER APPLICATION: SHX5852

## 2019-08-30 HISTORY — PX: CYSTOSCOPY WITH LITHOLAPAXY: SHX1425

## 2019-08-30 HISTORY — PX: CYSTOSCOPY WITH RETROGRADE PYELOGRAM, URETEROSCOPY AND STENT PLACEMENT: SHX5789

## 2019-08-30 LAB — GLUCOSE, CAPILLARY
Glucose-Capillary: 182 mg/dL — ABNORMAL HIGH (ref 70–99)
Glucose-Capillary: 159 mg/dL — ABNORMAL HIGH (ref 70–99)
Glucose-Capillary: 251 mg/dL — ABNORMAL HIGH (ref 70–99)

## 2019-08-30 SURGERY — TURP (TRANSURETHRAL RESECTION OF PROSTATE)
Anesthesia: General

## 2019-08-30 MED ORDER — LIDOCAINE HCL (CARDIAC) PF 100 MG/5ML IV SOSY
PREFILLED_SYRINGE | INTRAVENOUS | Status: DC | PRN
  Administered 2019-08-30: 100 mg via INTRAVENOUS

## 2019-08-30 MED ORDER — FENTANYL CITRATE (PF) 100 MCG/2ML IJ SOLN
25.0000 ug | INTRAMUSCULAR | Status: DC | PRN
  Administered 2019-08-30: 50 ug via INTRAVENOUS

## 2019-08-30 MED ORDER — DEXAMETHASONE SODIUM PHOSPHATE 4 MG/ML IJ SOLN
INTRAMUSCULAR | Status: DC | PRN
  Administered 2019-08-30: 5 mg via INTRAVENOUS

## 2019-08-30 MED ORDER — SODIUM CHLORIDE 0.9 % IR SOLN
Status: DC | PRN
  Administered 2019-08-30 (×13): 3000 mL

## 2019-08-30 MED ORDER — SODIUM CHLORIDE 0.9 % IV SOLN
2.0000 g | INTRAVENOUS | Status: AC
  Administered 2019-08-30: 2 g via INTRAVENOUS
  Filled 2019-08-30: qty 20

## 2019-08-30 MED ORDER — ONDANSETRON HCL 4 MG/2ML IJ SOLN
INTRAMUSCULAR | Status: DC | PRN
  Administered 2019-08-30: 4 mg via INTRAVENOUS

## 2019-08-30 MED ORDER — PROPOFOL 10 MG/ML IV BOLUS
INTRAVENOUS | Status: AC
  Filled 2019-08-30: qty 40

## 2019-08-30 MED ORDER — FENTANYL CITRATE (PF) 100 MCG/2ML IJ SOLN
INTRAMUSCULAR | Status: AC
  Filled 2019-08-30: qty 2

## 2019-08-30 MED ORDER — FLUTICASONE PROPIONATE 50 MCG/ACT NA SUSP
1.0000 | Freq: Every day | NASAL | Status: DC | PRN
  Filled 2019-08-30: qty 16

## 2019-08-30 MED ORDER — PROPOFOL 10 MG/ML IV BOLUS
INTRAVENOUS | Status: DC | PRN
  Administered 2019-08-30: 130 mg via INTRAVENOUS
  Administered 2019-08-30: 40 mg via INTRAVENOUS

## 2019-08-30 MED ORDER — LORATADINE 10 MG PO TABS
10.0000 mg | ORAL_TABLET | Freq: Every day | ORAL | Status: DC
  Filled 2019-08-30: qty 1

## 2019-08-30 MED ORDER — PIOGLITAZONE HCL 15 MG PO TABS
15.0000 mg | ORAL_TABLET | Freq: Every day | ORAL | Status: DC
  Administered 2019-08-31: 15 mg via ORAL
  Filled 2019-08-30: qty 1

## 2019-08-30 MED ORDER — ROSUVASTATIN CALCIUM 5 MG PO TABS
5.0000 mg | ORAL_TABLET | Freq: Every day | ORAL | Status: DC
  Administered 2019-08-30: 5 mg via ORAL
  Filled 2019-08-30: qty 1

## 2019-08-30 MED ORDER — LACTATED RINGERS IV SOLN: INTRAVENOUS | Status: DC

## 2019-08-30 MED ORDER — 0.9 % SODIUM CHLORIDE (POUR BTL) OPTIME
TOPICAL | Status: DC | PRN
  Administered 2019-08-30: 1000 mL

## 2019-08-30 MED ORDER — CHLORHEXIDINE GLUCONATE CLOTH 2 % EX PADS
6.0000 | MEDICATED_PAD | Freq: Every day | CUTANEOUS | Status: DC
  Administered 2019-08-31: 6 via TOPICAL

## 2019-08-30 MED ORDER — PHENYLEPHRINE HCL (PRESSORS) 10 MG/ML IV SOLN
INTRAVENOUS | Status: DC | PRN
  Administered 2019-08-30: 120 ug via INTRAVENOUS
  Administered 2019-08-30 (×3): 80 ug via INTRAVENOUS

## 2019-08-30 MED ORDER — ONDANSETRON HCL 4 MG/2ML IJ SOLN: 4.0000 mg | Freq: Once | INTRAMUSCULAR | Status: DC | PRN

## 2019-08-30 MED ORDER — CHLORHEXIDINE GLUCONATE 0.12 % MT SOLN
15.0000 mL | Freq: Once | OROMUCOSAL | Status: AC
  Administered 2019-08-30: 15 mL via OROMUCOSAL

## 2019-08-30 MED ORDER — METFORMIN HCL ER 500 MG PO TB24
1000.0000 mg | ORAL_TABLET | Freq: Two times a day (BID) | ORAL | Status: DC
  Administered 2019-08-30 – 2019-08-31 (×2): 1000 mg via ORAL
  Filled 2019-08-30 (×2): qty 2

## 2019-08-30 MED ORDER — FENTANYL CITRATE (PF) 100 MCG/2ML IJ SOLN
INTRAMUSCULAR | Status: DC | PRN
  Administered 2019-08-30 (×5): 25 ug via INTRAVENOUS
  Administered 2019-08-30 (×2): 50 ug via INTRAVENOUS
  Administered 2019-08-30: 25 ug via INTRAVENOUS
  Administered 2019-08-30: 50 ug via INTRAVENOUS

## 2019-08-30 MED ORDER — TAMSULOSIN HCL 0.4 MG PO CAPS
0.8000 mg | ORAL_CAPSULE | Freq: Every day | ORAL | Status: DC
  Administered 2019-08-30: 0.8 mg via ORAL
  Filled 2019-08-30: qty 2

## 2019-08-30 MED ORDER — ACETAMINOPHEN 10 MG/ML IV SOLN: 1000.0000 mg | Freq: Once | INTRAVENOUS | Status: DC | PRN

## 2019-08-30 MED ORDER — ACETAMINOPHEN 325 MG PO TABS
650.0000 mg | ORAL_TABLET | Freq: Four times a day (QID) | ORAL | Status: DC | PRN
  Administered 2019-08-30: 650 mg via ORAL
  Filled 2019-08-30: qty 2

## 2019-08-30 MED ORDER — GLIMEPIRIDE 2 MG PO TABS
2.0000 mg | ORAL_TABLET | Freq: Every day | ORAL | Status: DC
  Administered 2019-08-31: 2 mg via ORAL
  Filled 2019-08-30: qty 1

## 2019-08-30 MED ORDER — IOHEXOL 300 MG/ML  SOLN
INTRAMUSCULAR | Status: DC | PRN
  Administered 2019-08-30: 12 mL

## 2019-08-30 MED ORDER — ORAL CARE MOUTH RINSE: 15.0000 mL | Freq: Once | OROMUCOSAL | Status: AC

## 2019-08-30 MED ORDER — LISINOPRIL 5 MG PO TABS
5.0000 mg | ORAL_TABLET | Freq: Every day | ORAL | Status: DC
  Administered 2019-08-31: 5 mg via ORAL
  Filled 2019-08-30: qty 1

## 2019-08-30 SURGICAL SUPPLY — 30 items
BAG URINE DRAIN 2000ML AR STRL (UROLOGICAL SUPPLIES) IMPLANT
BAG URO CATCHER STRL LF (MISCELLANEOUS) ×4 IMPLANT
BASKET ZERO TIP NITINOL 2.4FR (BASKET) IMPLANT
CATH FOLEY 2WAY SLVR 30CC 20FR (CATHETERS) IMPLANT
CATH FOLEY 3WAY 30CC 20FR (CATHETERS) IMPLANT
CATH HEMA 3WAY 30CC 22FR COUDE (CATHETERS) ×4 IMPLANT
CATH URET 5FR 28IN OPEN ENDED (CATHETERS) ×4 IMPLANT
CLOTH BEACON ORANGE TIMEOUT ST (SAFETY) ×4 IMPLANT
DRSG TEGADERM 2-3/8X2-3/4 SM (GAUZE/BANDAGES/DRESSINGS) ×4 IMPLANT
EXTRACTOR STONE 1.7FRX115CM (UROLOGICAL SUPPLIES) IMPLANT
FIBER LASER FLEXIVA 1000 (UROLOGICAL SUPPLIES) ×4 IMPLANT
FIBER LASER FLEXIVA 365 (UROLOGICAL SUPPLIES) ×4 IMPLANT
FIBER LASER TRAC TIP (UROLOGICAL SUPPLIES) ×4 IMPLANT
GLOVE BIO SURGEON STRL SZ 6.5 (GLOVE) ×3 IMPLANT
GLOVE BIO SURGEONS STRL SZ 6.5 (GLOVE) ×1
GOWN STRL REUS W/TWL LRG LVL3 (GOWN DISPOSABLE) ×4 IMPLANT
GUIDEWIRE STR DUAL SENSOR (WIRE) ×4 IMPLANT
HOLDER FOLEY CATH W/STRAP (MISCELLANEOUS) ×4 IMPLANT
KIT TURNOVER KIT A (KITS) ×4 IMPLANT
LOOP CUT BIPOLAR 24F LRG (ELECTROSURGICAL) ×4 IMPLANT
MANIFOLD NEPTUNE II (INSTRUMENTS) ×4 IMPLANT
PACK CYSTO (CUSTOM PROCEDURE TRAY) ×4 IMPLANT
SHEATH URETERAL 12FRX28CM (UROLOGICAL SUPPLIES) IMPLANT
SHEATH URETERAL 12FRX35CM (MISCELLANEOUS) IMPLANT
SYR 30ML LL (SYRINGE) ×4 IMPLANT
SYR TOOMEY IRRIG 70ML (MISCELLANEOUS) ×4
SYRINGE TOOMEY IRRIG 70ML (MISCELLANEOUS) ×2 IMPLANT
TUBING CONNECTING 10 (TUBING) ×3 IMPLANT
TUBING CONNECTING 10' (TUBING) ×1
TUBING UROLOGY SET (TUBING) ×4 IMPLANT

## 2019-08-30 NOTE — Anesthesia Postprocedure Evaluation (Signed)
Anesthesia Post Note  Patient: Kenneth Chen  Procedure(s) Performed: TRANSURETHRAL RESECTION OF THE PROSTATE (TURP) (N/A ) CYSTOSCOPY WITH RETROGRADE PYELOGRAM, URETEROSCOPY AND STENT PLACEMENT (Left ) CYSTOSCOPY WITH LITHOLAPAXY (N/A ) HOLMIUM LASER APPLICATION (Left )     Patient location during evaluation: PACU Anesthesia Type: General Level of consciousness: awake and alert Pain management: pain level controlled Vital Signs Assessment: post-procedure vital signs reviewed and stable Respiratory status: spontaneous breathing, nonlabored ventilation, respiratory function stable and patient connected to nasal cannula oxygen Cardiovascular status: blood pressure returned to baseline and stable Postop Assessment: no apparent nausea or vomiting Anesthetic complications: no   No complications documented.  Last Vitals:  Vitals:   08/30/19 1238 08/30/19 1545  BP:  (!) 144/82  Pulse: (!) 112 88  Resp:  14  Temp:  (!) 36.3 C  SpO2: 99% 100%    Last Pain:  Vitals:   08/30/19 1545  TempSrc:   PainSc: 0-No pain                 Barnet Glasgow

## 2019-08-30 NOTE — Progress Notes (Signed)
Informed Dr. Valma Cava that the patient drank 12 oz.of water at 1015 this morning.  He okayed this and said the patient will be fine.

## 2019-08-30 NOTE — Progress Notes (Signed)
Received pt from PACU in stable condition. Pt CBI patent with pink urine noted. VSs. Denies pain. Spouse at bedside with patient. SRP, RN

## 2019-08-30 NOTE — Anesthesia Procedure Notes (Addendum)
Procedure Name: LMA Insertion Date/Time: 08/30/2019 1:24 PM Performed by: Lavina Hamman, CRNA Pre-anesthesia Checklist: Emergency Drugs available, Patient identified, Suction available and Patient being monitored Patient Re-evaluated:Patient Re-evaluated prior to induction Oxygen Delivery Method: Circle system utilized Preoxygenation: Pre-oxygenation with 100% oxygen Induction Type: IV induction LMA: LMA with gastric port inserted LMA Size: 4.0 Number of attempts: 1 Placement Confirmation: CO2 detector and breath sounds checked- equal and bilateral Tube secured with: Tape Dental Injury: Teeth and Oropharynx as per pre-operative assessment  Comments: Inserted by Frutoso Chase

## 2019-08-30 NOTE — Plan of Care (Signed)
  Problem: Health Behavior/Discharge Planning: Goal: Ability to manage health-related needs will improve Outcome: Progressing   

## 2019-08-30 NOTE — Transfer of Care (Signed)
Immediate Anesthesia Transfer of Care Note  Patient: Kenneth Chen  Procedure(s) Performed: TRANSURETHRAL RESECTION OF THE PROSTATE (TURP) (N/A ) CYSTOSCOPY WITH RETROGRADE PYELOGRAM, URETEROSCOPY AND STENT PLACEMENT (Left ) CYSTOSCOPY WITH LITHOLAPAXY (N/A ) HOLMIUM LASER APPLICATION (Left )  Patient Location: PACU  Anesthesia Type:General  Level of Consciousness: awake and alert   Airway & Oxygen Therapy: Patient Spontanous Breathing and Patient connected to face mask oxygen  Post-op Assessment: Report given to RN and Post -op Vital signs reviewed and stable  Post vital signs: Reviewed and stable  Last Vitals:  Vitals Value Taken Time  BP 155/82 08/30/19 1543  Temp    Pulse 88 08/30/19 1544  Resp 9 08/30/19 1544  SpO2 100 % 08/30/19 1544  Vitals shown include unvalidated device data.  Last Pain:  Vitals:   08/30/19 1225  TempSrc:   PainSc: 0-No pain         Complications: No complications documented.

## 2019-08-30 NOTE — Interval H&P Note (Signed)
History and Physical Interval Note: Patient feels like he has passed his left-sided ureteral calculus.  I will still proceed with a retrograde pyelogram as well as cystolitholapaxy and TURP.  08/30/2019 1:03 PM  Kenneth Chen  has presented today for surgery, with the diagnosis of LEFT URETERAL CALCULI, BLADDER CALCULI, BENIGN PROSTATIC HYPERPLASIA.  The various methods of treatment have been discussed with the patient and family. After consideration of risks, benefits and other options for treatment, the patient has consented to  Procedure(s) with comments: [place] (TURP) (N/A) - 3 HRS CYSTOSCOPY WITH RETROGRADE PYELOGRAM, URETEROSCOPY AND STENT PLACEMENT (Left) CYSTOSCOPY WITH LITHOLAPAXY (N/A) HOLMIUM LASER APPLICATION (Left) as a surgical intervention.  The patient's history has been reviewed, patient examined, no change in status, stable for surgery.  I have reviewed the patient's chart and labs.  Questions were answered to the patient's satisfaction.     Ahuva Poynor D Berish Bohman

## 2019-08-30 NOTE — Anesthesia Preprocedure Evaluation (Addendum)
Anesthesia Evaluation  Patient identified by MRN, date of birth, ID band Patient awake    Reviewed: Allergy & Precautions, NPO status , Patient's Chart, lab work & pertinent test results  Airway Mallampati: II  TM Distance: >3 FB Neck ROM: Full    Dental no notable dental hx. (+) Upper Dentures, Dental Advisory Given   Pulmonary Current Smoker and Patient abstained from smoking.,    Pulmonary exam normal breath sounds clear to auscultation       Cardiovascular Exercise Tolerance: Good negative cardio ROS Normal cardiovascular exam Rhythm:Regular Rate:Normal     Neuro/Psych    GI/Hepatic negative GI ROS, Neg liver ROS,   Endo/Other  diabetes, Type 2  Renal/GU   negative genitourinary   Musculoskeletal negative musculoskeletal ROS (+)   Abdominal   Peds  Hematology Lab Results      Component                Value               Date                      WBC                      9.4                 08/24/2019                HGB                      14.0                08/24/2019                HCT                      44.0                08/24/2019                MCV                      95.4                08/24/2019                PLT                      291                 08/24/2019              Anesthesia Other Findings   Reproductive/Obstetrics negative OB ROS                            Anesthesia Physical Anesthesia Plan  ASA: II  Anesthesia Plan: General   Post-op Pain Management:    Induction: Intravenous  PONV Risk Score and Plan: 3 and Treatment may vary due to age or medical condition, Ondansetron and Dexamethasone  Airway Management Planned: LMA  Additional Equipment: None  Intra-op Plan:   Post-operative Plan:   Informed Consent: I have reviewed the patients History and Physical, chart, labs and discussed the procedure including the risks, benefits and  alternatives for the proposed anesthesia with the patient or authorized representative who has  indicated his/her understanding and acceptance.     Dental advisory given  Plan Discussed with: CRNA  Anesthesia Plan Comments:         Anesthesia Quick Evaluation

## 2019-08-30 NOTE — Op Note (Signed)
PATIENT:  Kenneth Chen  PRE-OPERATIVE DIAGNOSIS: Left ureteral calculus, large bladder calculus, BPH with lower urinary tract symptoms  POST-OPERATIVE DIAGNOSIS: Large bladder calculus and BPH with lower urinary tract symptoms  PROCEDURE: Cystoscopy, left retrograde pyelogram, cystolitholapaxy, transurethral resection of the prostate  SURGEON:  Jacalyn Lefevre, MD  INDICATION: Kenneth Chen is a 74 year old man with a history of nephrolithiasis who presented with left flank pain found to have 2 distal ureteral calculi, large greater than 3 cm bladder stone and BPH.  ANESTHESIA:  General  EBL:  Minimal  DRAINS: 22Fr 3-way foley on continuous bladder irrigation  FINDINGS: 1. Normal anterior urethra 2. Lateral lobe hypertrophy with median lobe 3. Bilateral orthotopic ureteral orifices  4. Left retrograde pyelogram without filling defects or hydronephrosis 5. Normal bladder mucosa without masses 6. >3cm bladder stone  SPECIMEN:  Prostate chips to pathology, bladder stone for stone analysis  After informed consent the patient was brought to the major OR and placed on the table. He was administered general anesthesia and then moved to the dorsal lithotomy position. His genitalia was sterilely prepped and draped and an official timeout was then performed.  67 French rigid cystoscope was advanced into the urethral meatus and advanced in the bladder and direct visualization.  Findings noted above.  A ureteral catheter was then placed in the left ureteral orifice and a retrograde pyelogram was obtained.  There is no filling defects noted.  Attention then turned to the large bladder stone.  1000 m laser fiber was then used to fragment the stone in the pieces were evacuated with a [place] syringe.  After the stone was completely fragmented and removed from the bladder attention then turned to the trans with resection of the prostate.  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  The prostate adenoma from the bladder neck back to the verumontanum was resected beginning at the six o'clock position and then extended to include the right and left lobes of the prostate and anterior prostate. Care was taken not to resect distal to the verumontanum.  Hemostasis was then achieved with the cautery and the bladder was emptied and reinspected with no significant bleeding noted at the end of the procedure.     The resectoscope was therefore removed and the 22 French three-way Foley catheter was then inserted and balloon was filled to 30 cc. This was placed on mild traction and the bladder was irrigated with the irrigant returning clear. The catheter was then hooked to closed system drainage and continuous irrigation and the patient was awakened and taken to recovery room in stable and satisfactory condition. He tolerated the procedure well and there were no intraoperative complications.  PLAN OF CARE: Observation overnight with anticipated discharge in the morning. Void trial in 3 days.   PATIENT DISPOSITION:  PACU - Hemodynamically stable.

## 2019-08-31 ENCOUNTER — Encounter (HOSPITAL_COMMUNITY): Payer: Self-pay | Admitting: Urology

## 2019-08-31 DIAGNOSIS — N210 Calculus in bladder: Secondary | ICD-10-CM | POA: Diagnosis not present

## 2019-08-31 LAB — SURGICAL PATHOLOGY

## 2019-08-31 LAB — GLUCOSE, CAPILLARY: Glucose-Capillary: 136 mg/dL — ABNORMAL HIGH (ref 70–99)

## 2019-08-31 MED ORDER — SODIUM CHLORIDE 0.9 % IR SOLN
3000.0000 mL | Status: DC
  Administered 2019-08-31: 3000 mL

## 2019-08-31 MED ORDER — BELLADONNA ALKALOIDS-OPIUM 16.2-60 MG RE SUPP: 1.0000 | Freq: Four times a day (QID) | RECTAL | Status: DC | PRN

## 2019-08-31 NOTE — Discharge Instructions (Signed)
Transurethral Resection of the Prostate (TURP) or Greenlight laser ablation of the Prostate  Care After  Refer to this sheet in the next few weeks. These discharge instructions provide you with general information on caring for yourself after you leave the hospital. Your caregiver may also give you specific instructions. Your treatment has been planned according to the most current medical practices available, but unavoidable complications sometimes occur. If you have any problems or questions after discharge, please call your caregiver.  HOME CARE INSTRUCTIONS   Medications You may receive medicine for pain management. As your level of discomfort decreases, adjustments in your pain medicines may be made.  Take all medicines as directed.  You may be given a medicine (antibiotic) to kill germs following surgery. Finish all medicines. Let your caregiver know if you have any side effects or problems from the medicine.  If you are on aspirin, it would be best not to restart the aspirin until the blood in the urine clears Hygiene You can take a shower after surgery.  You should not take a bath while you still have the urethral catheter. Activity You will be encouraged to get out of bed as much as possible and increase your activity level as tolerated.  Spend the first week in and around your home. For 3 weeks, avoid the following:  Straining.  Running.  Strenuous work.  Walks longer than a few blocks.  Riding for extended periods.  Sexual relations.  Do not lift heavy objects (more than 20 pounds) for at least 1 month. When lifting, use your arms instead of your abdominal muscles.  You will be encouraged to walk as tolerated. Do not exert yourself. Increase your activity level slowly. Remember that it is important to keep moving after an operation of any type. This cuts down on the possibility of developing blood clots.  Your caregiver will tell you when you can resume driving and light  housework. Discuss this at your first office visit after discharge. Diet No special diet is ordered after a TURP. However, if you are on a special diet for another medical problem, it should be continued.  Normal fluid intake is usually recommended.  Avoid alcohol and caffeinated drinks for 2 weeks. They irritate the bladder. Decaffeinated drinks are okay.  Avoid spicy foods.  Bladder Function For the first 10 days, empty the bladder whenever you feel a definite desire. Do not try to hold the urine for long periods of time.  Urinating once or twice a night even after you are healed is not uncommon.  You may see some recurrence of blood in the urine after discharge from the hospital. This usually happens within 2 weeks after the procedure.If this occurs, force fluids again as you did in the hospital and reduce your activity.  Bowel Function You may experience some constipation after surgery. This can be minimized by increasing fluids and fiber in your diet. Drink enough water and fluids to keep your urine clear or pale yellow.  A stool softener may be prescribed for use at home. Do not strain to move your bowels.  If you are requiring increased pain medicine, it is important that you take stool softeners to prevent constipation. This will help to promote proper healing by reducing the need to strain to move your bowels.  Sexual Activity Semen movement in the opposite direction and into the bladder (retrograde ejaculation) may occur. Since the semen passes into the bladder, cloudy urine can occur the first time you urinate   in the opposite direction and into the bladder (retrograde ejaculation) may occur. Since the semen passes into the bladder, cloudy urine can occur the first time you urinate after intercourse. Or, you may not have an ejaculation during erection. Ask your caregiver when you can resume sexual activity. Retrograde ejaculation and reduced semen discharge should not reduce one's pleasure of intercourse.  Postoperative Visit  Arrange the date and time of your after surgery visit with your caregiver.  Return  to Work  After your recovery is complete, you will be able to return to work and resume all activities. Your caregiver will inform you when you can return to work.    Foley Catheter Care A soft, flexible tube (Foley catheter) may have been placed in your bladder to drain urine and fluid. Follow these instructions: Taking Care of the Catheter  Keep the area where the catheter leaves your body clean.   Attach the catheter to the leg so there is no tension on the catheter.   Keep the drainage bag below the level of the bladder, but keep it OFF the floor.   Do not take long soaking baths. Your caregiver will give instructions about showering.   Wash your hands before touching ANYTHING related to the catheter or bag.   Using mild soap and warm water on a washcloth:   Clean the area closest to the catheter insertion site using a circular motion around the catheter.   Clean the catheter itself by wiping AWAY from the insertion site for several inches down the tube.   NEVER wipe upward as this could sweep bacteria up into the urethra (tube in your body that normally drains the bladder) and cause infection.   Place a small amount of sterile lubricant at the tip of the penis where the catheter is entering.  Taking Care of the Drainage Bags  Two drainage bags may be taken home: a large overnight drainage bag, and a smaller leg bag which fits underneath clothing.   It is okay to wear the overnight bag at any time, but NEVER wear the smaller leg bag at night.   Keep the drainage bag well below the level of your bladder. This prevents backflow of urine into the bladder and allows the urine to drain freely.   Anchor the tubing to your leg to prevent pulling or tension on the catheter. Use tape or a leg strap provided by the hospital.   Empty the drainage bag when it is 1/2 to 3/4 full. Wash your hands before and after touching the bag.   Periodically check the tubing for kinks to make sure  there is no pressure on the tubing which could restrict the flow of urine.  Changing the Drainage Bags  Cleanse both ends of the clean bag with alcohol before changing.   Pinch off the rubber catheter to avoid urine spillage during the disconnection.   Disconnect the dirty bag and connect the clean one.   Empty the dirty bag carefully to avoid a urine spill.   Attach the new bag to the leg with tape or a leg strap.  Cleaning the Drainage Bags  Whenever a drainage bag is disconnected, it must be cleaned quickly so it is ready for the next use.   Wash the bag in warm, soapy water.   Rinse the bag thoroughly with warm water.   Soak the bag for 30 minutes in a solution of white vinegar and water (1 cup vinegar to 1 quart warm  water).   Rinse with warm water.  Removal of catheter Remove the foley catheter on Friday, July 2 in the morning.  This can be done easily by cutting the side port of the catheter, which will allow the balloon to deflate.  You will see 1-2 teaspoons of clear water as the balloon deflates and then the catheter can be slid out without difficulty.        Cut here   1.  Expect to see some bloody urethral drainage as well as blood in the initial part of your urinary stream for a few days  2.  Limit your exertional activity for approximately 2 weeks after the procedure.  If you are having no bloody drainage or pain at that point, you may liberalize your activities  3.  Keep your follow-up appointment that is scheduled.  If you have problems before the appointment such as continued large clots in your urine, difficulty urinating or fever, contact us before at [phone]  SEEK MEDICAL CARE IF:   You have chills or night sweats.   You are leaking around your catheter or have problems with your catheter. It is not uncommon to have sporadic leakage around your catheter as a result of bladder spasms. If the leakage stops, there is not much need for concern. If you  are uncertain, call your caregiver.   You develop side effects that you think are coming from your medicines.  SEEK IMMEDIATE MEDICAL CARE IF:   You are suddenly unable to urinate. Check to see if there are any kinks in the drainage tubing that may cause this. If you cannot find any kinks, call your caregiver immediately. This is an emergency.   You develop shortness of breath or chest pains.   Bleeding persists or clots develop in your urine.   You have a fever.   You develop pain in your back or over your lower belly (abdomen).   You develop pain or swelling in your legs.   Any problems you are having get worse rather than better.  MAKE SURE YOU:   Understand these instructions.   Will watch your condition.   Will get help right away if you are not doing well or get worse.

## 2019-08-31 NOTE — Discharge Summary (Addendum)
Date of admission: 08/30/2019  Date of discharge: 08/31/2019  Admission diagnosis: left ureteral calculus, bladder calculus, BPH with lower urinary tract symptoms   Discharge diagnosis: bladder calculus, BPH with lower urinary tract symptoms  Secondary diagnoses:  Patient Active Problem List   Diagnosis Date Noted  . BPH with obstruction/lower urinary tract symptoms 08/30/2019    Procedures performed: Procedure(s): TRANSURETHRAL RESECTION OF THE PROSTATE (TURP) CYSTOSCOPY WITH RETROGRADE PYELOGRAM, URETEROSCOPY AND STENT PLACEMENT CYSTOSCOPY WITH LITHOLAPAXY HOLMIUM LASER APPLICATION  History and Physical: For full details, please see admission history and physical. Briefly, Kenneth Chen is a 74 y.o. year old patient with history of BPH, bladder calculus and left ureteral caclulus.   Hospital Course: Patient tolerated the procedure well.  He was then transferred to the floor after an uneventful PACU stay.  His hospital course was uncomplicated.  On POD#1 he had met discharge criteria: was eating a regular diet, was up and ambulating independently,  pain was well controlled, and urine was clear/pink tinged with CBI on very slow drip.  CBI was clamped and patient was discharged home with foley catheter.     Laboratory values:  No results for input(s): WBC, HGB, HCT in the last 72 hours. No results for input(s): NA, K, CL, CO2, GLUCOSE, BUN, CREATININE, CALCIUM in the last 72 hours. No results for input(s): LABPT, INR in the last 72 hours. No results for input(s): LABURIN in the last 72 hours. Results for orders placed or performed during the hospital encounter of 08/26/19  SARS CORONAVIRUS 2 (TAT 6-24 HRS) Nasopharyngeal Nasopharyngeal Swab     Status: None   Collection Time: 08/26/19  2:32 PM   Specimen: Nasopharyngeal Swab  Result Value Ref Range Status   SARS Coronavirus 2 NEGATIVE NEGATIVE Final    Comment: (NOTE) SARS-CoV-2 target nucleic acids are NOT DETECTED.  The  SARS-CoV-2 RNA is generally detectable in upper and lower respiratory specimens during the acute phase of infection. Negative results do not preclude SARS-CoV-2 infection, do not rule out co-infections with other pathogens, and should not be used as the sole basis for treatment or other patient management decisions. Negative results must be combined with clinical observations, patient history, and epidemiological information. The expected result is Negative.  Fact Sheet for Patients: SugarRoll.be  Fact Sheet for Healthcare Providers: https://www.woods-mathews.com/  This test is not yet approved or cleared by the [place] FDA and  has been authorized for detection and/or diagnosis of SARS-CoV-2 by FDA under an Emergency Use Authorization (EUA). This EUA will remain  in effect (meaning this test can be used) for the duration of the COVID-19 declaration under Se ction 564(b)(1) of the Act, 21 U.S.C. section 360bbb-3(b)(1), unless the authorization is terminated or revoked sooner.  Performed at [facility], [place] [address] [place], [place] [address]     Disposition: Home  Discharge instruction: The patient was instructed to be ambulatory but told to refrain from heavy lifting, strenuous activity, or driving.   Patient to remove catheter at home on Friday, July 2.   Discharge medications:  Allergies as of 08/31/2019   No Known Allergies     Medication List    TAKE these medications   Allegra-D Allergy & Congestion 180-240 MG 24 hr tablet Generic drug: fexofenadine-pseudoephedrine Take 1 tablet by mouth daily.   aspirin 81 MG tablet Take 81 mg by mouth daily.   fluticasone 50 MCG/ACT nasal spray Commonly known as: FLONASE Place 1 spray into both nostrils daily as needed for  allergies or rhinitis.   glimepiride 2 MG tablet Commonly known as: AMARYL Take 2 mg by mouth daily.   lisinopril 5 MG tablet Commonly known  as: ZESTRIL Take 5 mg by mouth daily.   metFORMIN 500 MG 24 hr tablet Commonly known as: GLUCOPHAGE-XR Take 1,000 mg by mouth 2 (two) times daily.   multivitamin with minerals Tabs tablet Take 1 tablet by mouth daily.   pioglitazone 15 MG tablet Commonly known as: ACTOS Take 15 mg by mouth daily.   rosuvastatin 5 MG tablet Commonly known as: CRESTOR Take 5 mg by mouth at bedtime.   tamsulosin 0.4 MG Caps capsule Commonly known as: FLOMAX Take 0.8 mg by mouth daily after supper.       Followup:   Follow-up Information    [facility] On 09/19/2019.   Why: 8:30am with Dr. Oretha Ellis information: [place]

## 2019-08-31 NOTE — Progress Notes (Signed)
Pt and wife provided with discharge packet and was given verbal discharge instructions. Extra foley supplies provided. Pt and wife verbalized understanding following demonstration of changing into leg bag, how to clean foley catheter, etc. IV was removed. Pt was provided with a cloth gown and was taken to the main entrance via wheelchair. Pt had no concerns or questions.

## 2019-09-19 DIAGNOSIS — R350 Frequency of micturition: Secondary | ICD-10-CM | POA: Diagnosis not present

## 2019-09-19 DIAGNOSIS — N401 Enlarged prostate with lower urinary tract symptoms: Secondary | ICD-10-CM | POA: Diagnosis not present

## 2019-09-27 DIAGNOSIS — N400 Enlarged prostate without lower urinary tract symptoms: Secondary | ICD-10-CM | POA: Diagnosis not present

## 2019-09-27 DIAGNOSIS — N182 Chronic kidney disease, stage 2 (mild): Secondary | ICD-10-CM | POA: Diagnosis not present

## 2019-09-27 DIAGNOSIS — E7800 Pure hypercholesterolemia, unspecified: Secondary | ICD-10-CM | POA: Diagnosis not present

## 2019-09-27 DIAGNOSIS — E1169 Type 2 diabetes mellitus with other specified complication: Secondary | ICD-10-CM | POA: Diagnosis not present

## 2019-10-24 DIAGNOSIS — E1169 Type 2 diabetes mellitus with other specified complication: Secondary | ICD-10-CM | POA: Diagnosis not present

## 2019-10-26 DIAGNOSIS — R635 Abnormal weight gain: Secondary | ICD-10-CM | POA: Diagnosis not present

## 2019-10-26 DIAGNOSIS — E1169 Type 2 diabetes mellitus with other specified complication: Secondary | ICD-10-CM | POA: Diagnosis not present

## 2019-10-27 DIAGNOSIS — Z23 Encounter for immunization: Secondary | ICD-10-CM | POA: Diagnosis not present

## 2019-10-31 DIAGNOSIS — N400 Enlarged prostate without lower urinary tract symptoms: Secondary | ICD-10-CM | POA: Diagnosis not present

## 2019-10-31 DIAGNOSIS — N182 Chronic kidney disease, stage 2 (mild): Secondary | ICD-10-CM | POA: Diagnosis not present

## 2019-10-31 DIAGNOSIS — E1169 Type 2 diabetes mellitus with other specified complication: Secondary | ICD-10-CM | POA: Diagnosis not present

## 2019-10-31 DIAGNOSIS — E7800 Pure hypercholesterolemia, unspecified: Secondary | ICD-10-CM | POA: Diagnosis not present

## 2019-11-17 DIAGNOSIS — Z23 Encounter for immunization: Secondary | ICD-10-CM | POA: Diagnosis not present

## 2019-12-21 DIAGNOSIS — Z87442 Personal history of urinary calculi: Secondary | ICD-10-CM | POA: Diagnosis not present

## 2019-12-21 DIAGNOSIS — R3914 Feeling of incomplete bladder emptying: Secondary | ICD-10-CM | POA: Diagnosis not present

## 2019-12-21 DIAGNOSIS — N401 Enlarged prostate with lower urinary tract symptoms: Secondary | ICD-10-CM | POA: Diagnosis not present

## 2020-01-31 DIAGNOSIS — L821 Other seborrheic keratosis: Secondary | ICD-10-CM | POA: Diagnosis not present

## 2020-01-31 DIAGNOSIS — D225 Melanocytic nevi of trunk: Secondary | ICD-10-CM | POA: Diagnosis not present

## 2020-01-31 DIAGNOSIS — L578 Other skin changes due to chronic exposure to nonionizing radiation: Secondary | ICD-10-CM | POA: Diagnosis not present

## 2020-01-31 DIAGNOSIS — D485 Neoplasm of uncertain behavior of skin: Secondary | ICD-10-CM | POA: Diagnosis not present

## 2020-01-31 DIAGNOSIS — L570 Actinic keratosis: Secondary | ICD-10-CM | POA: Diagnosis not present

## 2020-01-31 DIAGNOSIS — Z86018 Personal history of other benign neoplasm: Secondary | ICD-10-CM | POA: Diagnosis not present

## 2020-01-31 DIAGNOSIS — D2271 Melanocytic nevi of right lower limb, including hip: Secondary | ICD-10-CM | POA: Diagnosis not present

## 2020-01-31 DIAGNOSIS — Z86008 Personal history of in-situ neoplasm of other site: Secondary | ICD-10-CM | POA: Diagnosis not present

## 2020-02-03 DIAGNOSIS — Z03818 Encounter for observation for suspected exposure to other biological agents ruled out: Secondary | ICD-10-CM | POA: Diagnosis not present

## 2020-02-03 DIAGNOSIS — Z1152 Encounter for screening for COVID-19: Secondary | ICD-10-CM | POA: Diagnosis not present

## 2020-03-29 DIAGNOSIS — N182 Chronic kidney disease, stage 2 (mild): Secondary | ICD-10-CM | POA: Diagnosis not present

## 2020-03-29 DIAGNOSIS — E7800 Pure hypercholesterolemia, unspecified: Secondary | ICD-10-CM | POA: Diagnosis not present

## 2020-03-29 DIAGNOSIS — N400 Enlarged prostate without lower urinary tract symptoms: Secondary | ICD-10-CM | POA: Diagnosis not present

## 2020-03-29 DIAGNOSIS — E1169 Type 2 diabetes mellitus with other specified complication: Secondary | ICD-10-CM | POA: Diagnosis not present

## 2020-04-02 DIAGNOSIS — Z20822 Contact with and (suspected) exposure to covid-19: Secondary | ICD-10-CM | POA: Diagnosis not present

## 2020-04-04 DIAGNOSIS — U071 COVID-19: Secondary | ICD-10-CM | POA: Diagnosis not present

## 2020-04-09 DIAGNOSIS — N132 Hydronephrosis with renal and ureteral calculous obstruction: Secondary | ICD-10-CM | POA: Diagnosis not present

## 2020-04-09 DIAGNOSIS — R1084 Generalized abdominal pain: Secondary | ICD-10-CM | POA: Diagnosis not present

## 2020-04-09 DIAGNOSIS — N201 Calculus of ureter: Secondary | ICD-10-CM | POA: Diagnosis not present

## 2020-04-18 DIAGNOSIS — N201 Calculus of ureter: Secondary | ICD-10-CM | POA: Diagnosis not present

## 2020-04-20 DIAGNOSIS — N182 Chronic kidney disease, stage 2 (mild): Secondary | ICD-10-CM | POA: Diagnosis not present

## 2020-04-20 DIAGNOSIS — E7800 Pure hypercholesterolemia, unspecified: Secondary | ICD-10-CM | POA: Diagnosis not present

## 2020-04-20 DIAGNOSIS — Z125 Encounter for screening for malignant neoplasm of prostate: Secondary | ICD-10-CM | POA: Diagnosis not present

## 2020-04-20 DIAGNOSIS — E1169 Type 2 diabetes mellitus with other specified complication: Secondary | ICD-10-CM | POA: Diagnosis not present

## 2020-04-24 DIAGNOSIS — E7800 Pure hypercholesterolemia, unspecified: Secondary | ICD-10-CM | POA: Diagnosis not present

## 2020-04-24 DIAGNOSIS — Z0000 Encounter for general adult medical examination without abnormal findings: Secondary | ICD-10-CM | POA: Diagnosis not present

## 2020-04-24 DIAGNOSIS — Z125 Encounter for screening for malignant neoplasm of prostate: Secondary | ICD-10-CM | POA: Diagnosis not present

## 2020-04-24 DIAGNOSIS — E1169 Type 2 diabetes mellitus with other specified complication: Secondary | ICD-10-CM | POA: Diagnosis not present

## 2020-04-24 DIAGNOSIS — J309 Allergic rhinitis, unspecified: Secondary | ICD-10-CM | POA: Diagnosis not present

## 2020-04-24 DIAGNOSIS — N182 Chronic kidney disease, stage 2 (mild): Secondary | ICD-10-CM | POA: Diagnosis not present

## 2020-05-03 DIAGNOSIS — E1169 Type 2 diabetes mellitus with other specified complication: Secondary | ICD-10-CM | POA: Diagnosis not present

## 2020-05-03 DIAGNOSIS — N182 Chronic kidney disease, stage 2 (mild): Secondary | ICD-10-CM | POA: Diagnosis not present

## 2020-05-03 DIAGNOSIS — N400 Enlarged prostate without lower urinary tract symptoms: Secondary | ICD-10-CM | POA: Diagnosis not present

## 2020-05-03 DIAGNOSIS — E7800 Pure hypercholesterolemia, unspecified: Secondary | ICD-10-CM | POA: Diagnosis not present

## 2020-05-14 DIAGNOSIS — R944 Abnormal results of kidney function studies: Secondary | ICD-10-CM | POA: Diagnosis not present

## 2020-06-21 DIAGNOSIS — N202 Calculus of kidney with calculus of ureter: Secondary | ICD-10-CM | POA: Diagnosis not present

## 2020-06-22 DIAGNOSIS — E785 Hyperlipidemia, unspecified: Secondary | ICD-10-CM | POA: Diagnosis not present

## 2020-06-22 DIAGNOSIS — I10 Essential (primary) hypertension: Secondary | ICD-10-CM | POA: Diagnosis not present

## 2020-06-22 DIAGNOSIS — N400 Enlarged prostate without lower urinary tract symptoms: Secondary | ICD-10-CM | POA: Diagnosis not present

## 2020-06-22 DIAGNOSIS — E119 Type 2 diabetes mellitus without complications: Secondary | ICD-10-CM | POA: Diagnosis not present

## 2020-06-22 DIAGNOSIS — Z79899 Other long term (current) drug therapy: Secondary | ICD-10-CM | POA: Diagnosis not present

## 2020-06-22 DIAGNOSIS — G4733 Obstructive sleep apnea (adult) (pediatric): Secondary | ICD-10-CM | POA: Diagnosis not present

## 2020-06-29 DIAGNOSIS — Z23 Encounter for immunization: Secondary | ICD-10-CM | POA: Diagnosis not present

## 2020-07-06 DIAGNOSIS — Z20822 Contact with and (suspected) exposure to covid-19: Secondary | ICD-10-CM | POA: Diagnosis not present

## 2020-07-18 DIAGNOSIS — E1169 Type 2 diabetes mellitus with other specified complication: Secondary | ICD-10-CM | POA: Diagnosis not present

## 2020-07-18 DIAGNOSIS — N182 Chronic kidney disease, stage 2 (mild): Secondary | ICD-10-CM | POA: Diagnosis not present

## 2020-07-18 DIAGNOSIS — N400 Enlarged prostate without lower urinary tract symptoms: Secondary | ICD-10-CM | POA: Diagnosis not present

## 2020-07-18 DIAGNOSIS — E7800 Pure hypercholesterolemia, unspecified: Secondary | ICD-10-CM | POA: Diagnosis not present

## 2020-10-01 DIAGNOSIS — Z20822 Contact with and (suspected) exposure to covid-19: Secondary | ICD-10-CM | POA: Diagnosis not present

## 2020-10-19 DIAGNOSIS — I152 Hypertension secondary to endocrine disorders: Secondary | ICD-10-CM | POA: Diagnosis not present

## 2020-10-19 DIAGNOSIS — Z79899 Other long term (current) drug therapy: Secondary | ICD-10-CM | POA: Diagnosis not present

## 2020-10-19 DIAGNOSIS — E1159 Type 2 diabetes mellitus with other circulatory complications: Secondary | ICD-10-CM | POA: Diagnosis not present

## 2020-10-19 DIAGNOSIS — E7800 Pure hypercholesterolemia, unspecified: Secondary | ICD-10-CM | POA: Diagnosis not present

## 2020-10-26 DIAGNOSIS — Z79899 Other long term (current) drug therapy: Secondary | ICD-10-CM | POA: Diagnosis not present

## 2020-10-26 DIAGNOSIS — Z9989 Dependence on other enabling machines and devices: Secondary | ICD-10-CM | POA: Diagnosis not present

## 2020-10-26 DIAGNOSIS — E1122 Type 2 diabetes mellitus with diabetic chronic kidney disease: Secondary | ICD-10-CM | POA: Diagnosis not present

## 2020-10-26 DIAGNOSIS — Z125 Encounter for screening for malignant neoplasm of prostate: Secondary | ICD-10-CM | POA: Diagnosis not present

## 2020-10-26 DIAGNOSIS — G4733 Obstructive sleep apnea (adult) (pediatric): Secondary | ICD-10-CM | POA: Diagnosis not present

## 2020-10-26 DIAGNOSIS — N189 Chronic kidney disease, unspecified: Secondary | ICD-10-CM | POA: Diagnosis not present

## 2020-10-26 DIAGNOSIS — I129 Hypertensive chronic kidney disease with stage 1 through stage 4 chronic kidney disease, or unspecified chronic kidney disease: Secondary | ICD-10-CM | POA: Diagnosis not present

## 2020-11-01 DIAGNOSIS — Z20822 Contact with and (suspected) exposure to covid-19: Secondary | ICD-10-CM | POA: Diagnosis not present

## 2020-11-09 DIAGNOSIS — Z23 Encounter for immunization: Secondary | ICD-10-CM | POA: Diagnosis not present

## 2020-12-07 DIAGNOSIS — Z23 Encounter for immunization: Secondary | ICD-10-CM | POA: Diagnosis not present

## 2020-12-27 DIAGNOSIS — N401 Enlarged prostate with lower urinary tract symptoms: Secondary | ICD-10-CM | POA: Diagnosis not present

## 2020-12-27 DIAGNOSIS — R3912 Poor urinary stream: Secondary | ICD-10-CM | POA: Diagnosis not present

## 2020-12-27 DIAGNOSIS — N200 Calculus of kidney: Secondary | ICD-10-CM | POA: Diagnosis not present

## 2021-02-05 DIAGNOSIS — D225 Melanocytic nevi of trunk: Secondary | ICD-10-CM | POA: Diagnosis not present

## 2021-02-05 DIAGNOSIS — Z86018 Personal history of other benign neoplasm: Secondary | ICD-10-CM | POA: Diagnosis not present

## 2021-02-05 DIAGNOSIS — D485 Neoplasm of uncertain behavior of skin: Secondary | ICD-10-CM | POA: Diagnosis not present

## 2021-02-05 DIAGNOSIS — D2271 Melanocytic nevi of right lower limb, including hip: Secondary | ICD-10-CM | POA: Diagnosis not present

## 2021-02-05 DIAGNOSIS — Z86008 Personal history of in-situ neoplasm of other site: Secondary | ICD-10-CM | POA: Diagnosis not present

## 2021-02-05 DIAGNOSIS — L578 Other skin changes due to chronic exposure to nonionizing radiation: Secondary | ICD-10-CM | POA: Diagnosis not present

## 2021-02-05 DIAGNOSIS — L821 Other seborrheic keratosis: Secondary | ICD-10-CM | POA: Diagnosis not present

## 2021-02-16 DIAGNOSIS — Z20828 Contact with and (suspected) exposure to other viral communicable diseases: Secondary | ICD-10-CM | POA: Diagnosis not present

## 2021-04-26 DIAGNOSIS — Z125 Encounter for screening for malignant neoplasm of prostate: Secondary | ICD-10-CM | POA: Diagnosis not present

## 2021-04-26 DIAGNOSIS — E1122 Type 2 diabetes mellitus with diabetic chronic kidney disease: Secondary | ICD-10-CM | POA: Diagnosis not present

## 2021-04-26 DIAGNOSIS — I10 Essential (primary) hypertension: Secondary | ICD-10-CM | POA: Diagnosis not present

## 2021-04-26 DIAGNOSIS — Z79899 Other long term (current) drug therapy: Secondary | ICD-10-CM | POA: Diagnosis not present

## 2021-04-26 DIAGNOSIS — N183 Chronic kidney disease, stage 3 unspecified: Secondary | ICD-10-CM | POA: Diagnosis not present

## 2021-05-03 DIAGNOSIS — Z9989 Dependence on other enabling machines and devices: Secondary | ICD-10-CM | POA: Diagnosis not present

## 2021-05-03 DIAGNOSIS — E785 Hyperlipidemia, unspecified: Secondary | ICD-10-CM | POA: Diagnosis not present

## 2021-05-03 DIAGNOSIS — N189 Chronic kidney disease, unspecified: Secondary | ICD-10-CM | POA: Diagnosis not present

## 2021-05-03 DIAGNOSIS — Z0000 Encounter for general adult medical examination without abnormal findings: Secondary | ICD-10-CM | POA: Diagnosis not present

## 2021-05-03 DIAGNOSIS — E1122 Type 2 diabetes mellitus with diabetic chronic kidney disease: Secondary | ICD-10-CM | POA: Diagnosis not present

## 2021-05-03 DIAGNOSIS — Z1331 Encounter for screening for depression: Secondary | ICD-10-CM | POA: Diagnosis not present

## 2021-05-03 DIAGNOSIS — I129 Hypertensive chronic kidney disease with stage 1 through stage 4 chronic kidney disease, or unspecified chronic kidney disease: Secondary | ICD-10-CM | POA: Diagnosis not present

## 2021-05-03 DIAGNOSIS — Z79899 Other long term (current) drug therapy: Secondary | ICD-10-CM | POA: Diagnosis not present

## 2021-06-20 DIAGNOSIS — Z23 Encounter for immunization: Secondary | ICD-10-CM | POA: Diagnosis not present

## 2021-06-24 DIAGNOSIS — Z20822 Contact with and (suspected) exposure to covid-19: Secondary | ICD-10-CM | POA: Diagnosis not present

## 2021-06-29 DIAGNOSIS — Z20822 Contact with and (suspected) exposure to covid-19: Secondary | ICD-10-CM | POA: Diagnosis not present

## 2021-11-01 DIAGNOSIS — E7800 Pure hypercholesterolemia, unspecified: Secondary | ICD-10-CM | POA: Diagnosis not present

## 2021-11-01 DIAGNOSIS — E1122 Type 2 diabetes mellitus with diabetic chronic kidney disease: Secondary | ICD-10-CM | POA: Diagnosis not present

## 2021-11-01 DIAGNOSIS — N183 Chronic kidney disease, stage 3 unspecified: Secondary | ICD-10-CM | POA: Diagnosis not present

## 2021-11-08 DIAGNOSIS — Z79899 Other long term (current) drug therapy: Secondary | ICD-10-CM | POA: Diagnosis not present

## 2021-11-08 DIAGNOSIS — N400 Enlarged prostate without lower urinary tract symptoms: Secondary | ICD-10-CM | POA: Diagnosis not present

## 2021-11-08 DIAGNOSIS — Z125 Encounter for screening for malignant neoplasm of prostate: Secondary | ICD-10-CM | POA: Diagnosis not present

## 2021-11-08 DIAGNOSIS — E785 Hyperlipidemia, unspecified: Secondary | ICD-10-CM | POA: Diagnosis not present

## 2021-11-08 DIAGNOSIS — Z9989 Dependence on other enabling machines and devices: Secondary | ICD-10-CM | POA: Diagnosis not present

## 2021-11-08 DIAGNOSIS — E119 Type 2 diabetes mellitus without complications: Secondary | ICD-10-CM | POA: Diagnosis not present

## 2021-11-08 DIAGNOSIS — G4733 Obstructive sleep apnea (adult) (pediatric): Secondary | ICD-10-CM | POA: Diagnosis not present

## 2021-11-08 DIAGNOSIS — I10 Essential (primary) hypertension: Secondary | ICD-10-CM | POA: Diagnosis not present

## 2021-12-06 DIAGNOSIS — Z23 Encounter for immunization: Secondary | ICD-10-CM | POA: Diagnosis not present

## 2021-12-27 DIAGNOSIS — R3912 Poor urinary stream: Secondary | ICD-10-CM | POA: Diagnosis not present

## 2021-12-27 DIAGNOSIS — N401 Enlarged prostate with lower urinary tract symptoms: Secondary | ICD-10-CM | POA: Diagnosis not present

## 2022-02-07 DIAGNOSIS — D0359 Melanoma in situ of other part of trunk: Secondary | ICD-10-CM | POA: Diagnosis not present

## 2022-02-07 DIAGNOSIS — D485 Neoplasm of uncertain behavior of skin: Secondary | ICD-10-CM | POA: Diagnosis not present

## 2022-02-07 DIAGNOSIS — R238 Other skin changes: Secondary | ICD-10-CM | POA: Diagnosis not present

## 2022-02-07 DIAGNOSIS — D225 Melanocytic nevi of trunk: Secondary | ICD-10-CM | POA: Diagnosis not present

## 2022-02-07 DIAGNOSIS — D2271 Melanocytic nevi of right lower limb, including hip: Secondary | ICD-10-CM | POA: Diagnosis not present

## 2022-02-07 DIAGNOSIS — L821 Other seborrheic keratosis: Secondary | ICD-10-CM | POA: Diagnosis not present

## 2022-02-07 DIAGNOSIS — Z86018 Personal history of other benign neoplasm: Secondary | ICD-10-CM | POA: Diagnosis not present

## 2022-02-07 DIAGNOSIS — L578 Other skin changes due to chronic exposure to nonionizing radiation: Secondary | ICD-10-CM | POA: Diagnosis not present

## 2022-02-07 DIAGNOSIS — D1801 Hemangioma of skin and subcutaneous tissue: Secondary | ICD-10-CM | POA: Diagnosis not present

## 2022-02-07 DIAGNOSIS — Z86008 Personal history of in-situ neoplasm of other site: Secondary | ICD-10-CM | POA: Diagnosis not present

## 2022-02-19 DIAGNOSIS — L988 Other specified disorders of the skin and subcutaneous tissue: Secondary | ICD-10-CM | POA: Diagnosis not present

## 2022-02-19 DIAGNOSIS — D0359 Melanoma in situ of other part of trunk: Secondary | ICD-10-CM | POA: Diagnosis not present

## 2023-05-13 ENCOUNTER — Encounter: Payer: Self-pay | Admitting: Ophthalmology

## 2023-05-14 ENCOUNTER — Encounter: Payer: Self-pay | Admitting: Ophthalmology

## 2023-05-26 NOTE — Discharge Instructions (Signed)

## 2023-05-28 ENCOUNTER — Ambulatory Visit: Payer: Self-pay | Admitting: Anesthesiology

## 2023-05-28 ENCOUNTER — Ambulatory Visit
Admission: RE | Admit: 2023-05-28 | Discharge: 2023-05-28 | Disposition: A | Discharge status: Home / Self Care | Source: Ambulatory Visit | Attending: Ophthalmology | Admitting: Ophthalmology

## 2023-05-28 ENCOUNTER — Encounter: Payer: Self-pay | Admitting: Ophthalmology

## 2023-05-28 ENCOUNTER — Other Ambulatory Visit: Payer: Self-pay

## 2023-05-28 ENCOUNTER — Encounter
Admission: RE | Disposition: A | Discharge status: Home / Self Care | Payer: Self-pay | Source: Ambulatory Visit | Attending: Ophthalmology

## 2023-05-28 DIAGNOSIS — H25041 Posterior subcapsular polar age-related cataract, right eye: Secondary | ICD-10-CM | POA: Diagnosis present

## 2023-05-28 DIAGNOSIS — E1136 Type 2 diabetes mellitus with diabetic cataract: Secondary | ICD-10-CM | POA: Insufficient documentation

## 2023-05-28 DIAGNOSIS — F17290 Nicotine dependence, other tobacco product, uncomplicated: Secondary | ICD-10-CM | POA: Insufficient documentation

## 2023-05-28 DIAGNOSIS — N183 Chronic kidney disease, stage 3 unspecified: Secondary | ICD-10-CM | POA: Diagnosis not present

## 2023-05-28 DIAGNOSIS — Z7984 Long term (current) use of oral hypoglycemic drugs: Secondary | ICD-10-CM | POA: Diagnosis not present

## 2023-05-28 DIAGNOSIS — E1122 Type 2 diabetes mellitus with diabetic chronic kidney disease: Secondary | ICD-10-CM | POA: Diagnosis not present

## 2023-05-28 DIAGNOSIS — H2511 Age-related nuclear cataract, right eye: Secondary | ICD-10-CM | POA: Insufficient documentation

## 2023-05-28 HISTORY — DX: Type 2 diabetes mellitus with diabetic chronic kidney disease: E11.22

## 2023-05-28 HISTORY — DX: Hypertension secondary to endocrine disorders: I15.2

## 2023-05-28 HISTORY — PX: CATARACT EXTRACTION W/PHACO: SHX586

## 2023-05-28 HISTORY — DX: Chronic kidney disease, stage 3 unspecified: N18.30

## 2023-05-28 LAB — GLUCOSE, CAPILLARY: Glucose-Capillary: 125 mg/dL — ABNORMAL HIGH (ref 70–99)

## 2023-05-28 SURGERY — PHACOEMULSIFICATION, CATARACT, WITH IOL INSERTION
Anesthesia: Monitor Anesthesia Care | Laterality: Right

## 2023-05-28 MED ORDER — SIGHTPATH DOSE#1 NA HYALUR & NA CHOND-NA HYALUR IO KIT
PACK | INTRAOCULAR | Status: DC | PRN
  Administered 2023-05-28: 1 via OPHTHALMIC

## 2023-05-28 MED ORDER — TETRACAINE HCL 0.5 % OP SOLN
1.0000 [drp] | OPHTHALMIC | Status: DC | PRN
  Administered 2023-05-28 (×3): 1 [drp] via OPHTHALMIC

## 2023-05-28 MED ORDER — ARMC OPHTHALMIC DILATING DROPS
OPHTHALMIC | Status: AC
  Filled 2023-05-28: qty 0.5

## 2023-05-28 MED ORDER — BRIMONIDINE TARTRATE-TIMOLOL 0.2-0.5 % OP SOLN
OPHTHALMIC | Status: DC | PRN
  Administered 2023-05-28: 1 [drp] via OPHTHALMIC

## 2023-05-28 MED ORDER — MIDAZOLAM HCL 2 MG/2ML IJ SOLN
INTRAMUSCULAR | Status: DC | PRN
  Administered 2023-05-28 (×2): 1 mg via INTRAVENOUS

## 2023-05-28 MED ORDER — FENTANYL CITRATE (PF) 100 MCG/2ML IJ SOLN
INTRAMUSCULAR | Status: DC | PRN
  Administered 2023-05-28 (×4): 25 ug via INTRAVENOUS

## 2023-05-28 MED ORDER — MIDAZOLAM HCL 2 MG/2ML IJ SOLN
INTRAMUSCULAR | Status: AC
  Filled 2023-05-28: qty 2

## 2023-05-28 MED ORDER — LIDOCAINE HCL (PF) 2 % IJ SOLN
INTRAOCULAR | Status: DC | PRN
  Administered 2023-05-28: 1 mL via INTRAOCULAR

## 2023-05-28 MED ORDER — FENTANYL CITRATE (PF) 100 MCG/2ML IJ SOLN
INTRAMUSCULAR | Status: AC
  Filled 2023-05-28: qty 2

## 2023-05-28 MED ORDER — SIGHTPATH DOSE#1 BSS IO SOLN
INTRAOCULAR | Status: DC | PRN
  Administered 2023-05-28: 15 mL via INTRAOCULAR

## 2023-05-28 MED ORDER — MOXIFLOXACIN HCL 0.5 % OP SOLN
OPHTHALMIC | Status: DC | PRN
  Administered 2023-05-28: .2 mL via OPHTHALMIC

## 2023-05-28 MED ORDER — ARMC OPHTHALMIC DILATING DROPS
1.0000 | OPHTHALMIC | Status: DC | PRN
  Administered 2023-05-28 (×3): 1 via OPHTHALMIC

## 2023-05-28 MED ORDER — PHENYLEPHRINE-KETOROLAC 1-0.3 % IO SOLN
INTRAOCULAR | Status: DC | PRN
  Administered 2023-05-28: 82 mL via OPHTHALMIC

## 2023-05-28 MED ORDER — TETRACAINE HCL 0.5 % OP SOLN
OPHTHALMIC | Status: AC
  Filled 2023-05-28: qty 4

## 2023-05-28 SURGICAL SUPPLY — 14 items
CATARACT SUITE SIGHTPATH (MISCELLANEOUS) ×1 IMPLANT
DISSECTOR HYDRO NUCLEUS 50X22 (MISCELLANEOUS) ×1 IMPLANT
DRSG TEGADERM 2-3/8X2-3/4 SM (GAUZE/BANDAGES/DRESSINGS) ×1 IMPLANT
FEE CATARACT SUITE SIGHTPATH (MISCELLANEOUS) ×1 IMPLANT
GLOVE BIOGEL PI IND STRL 8 (GLOVE) ×1 IMPLANT
GLOVE SURG LX STRL 7.5 STRW (GLOVE) ×1 IMPLANT
GLOVE SURG PROTEXIS BL SZ6.5 (GLOVE) ×1 IMPLANT
GLOVE SURG SYN 6.5 PF PI BL (GLOVE) ×1 IMPLANT
LENS CLAREON 20.5 (Intraocular Lens) ×1 IMPLANT
LENS IOL CLRN 20.5 (Intraocular Lens) IMPLANT
NDL FILTER BLUNT 18X1 1/2 (NEEDLE) ×1 IMPLANT
NEEDLE FILTER BLUNT 18X1 1/2 (NEEDLE) ×1 IMPLANT
RING MALYGIN (MISCELLANEOUS) IMPLANT
SYR 3ML LL SCALE MARK (SYRINGE) ×1 IMPLANT

## 2023-05-28 NOTE — Transfer of Care (Signed)
 Immediate Anesthesia Transfer of Care Note  Patient: Kenneth Chen  Procedure(s) Performed: PHACOEMULSIFICATION, CATARACT, WITH IOL INSERTION 14.76, 01:28.7 (Right)  Patient Location: PACU  Anesthesia Type: MAC  Level of Consciousness: awake, alert  and patient cooperative  Airway and Oxygen Therapy: Patient Spontanous Breathing and Patient connected to supplemental oxygen  Post-op Assessment: Post-op Vital signs reviewed, Patient's Cardiovascular Status Stable, Respiratory Function Stable, Patent Airway and No signs of Nausea or vomiting  Post-op Vital Signs: Reviewed and stable  Complications: No notable events documented.

## 2023-05-28 NOTE — Anesthesia Preprocedure Evaluation (Signed)
 Anesthesia Evaluation  Patient identified by MRN, date of birth, ID band Patient awake    Reviewed: Allergy & Precautions, H&P , NPO status , Patient's Chart, lab work & pertinent test results  Airway Mallampati: IV  TM Distance: <3 FB Neck ROM: Full  Mouth opening: Limited Mouth Opening Comment: Large tongue,would expect likely difficult intubation if this were needed Dental  (+) Partial Upper   Pulmonary neg pulmonary ROS, Current Smoker and Patient abstained from smoking.   Pulmonary exam normal breath sounds clear to auscultation       Cardiovascular hypertension, negative cardio ROS Normal cardiovascular exam Rhythm:Regular Rate:Normal     Neuro/Psych negative neurological ROS  negative psych ROS   GI/Hepatic negative GI ROS, Neg liver ROS,,,  Endo/Other  negative endocrine ROSdiabetes    Renal/GU negative Renal ROS  negative genitourinary   Musculoskeletal negative musculoskeletal ROS (+)    Abdominal   Peds negative pediatric ROS (+)  Hematology negative hematology ROS (+)   Anesthesia Other Findings Type 2 diabetes mellitus (HCC) Bladder stones Lesion of bladder  History of kidney stones Hyperlipidemia  Allergic rhinitis Hematuria  Lower urinary tract symptoms (LUTS) Wears partial dentures  History of malignant melanoma of skin OSA on CPAP  Arthritis History of migraine  BPH (benign prostatic hyperplasia) History of kidney stones  CKD stage 3 due to type 2 diabetes mellitus (HCC) Hypertension associated with type 2 diabetes mellitus (HCC)     Reproductive/Obstetrics negative OB ROS                              Anesthesia Physical Anesthesia Plan  ASA: 3  Anesthesia Plan: MAC   Post-op Pain Management:    Induction: Intravenous  PONV Risk Score and Plan:   Airway Management Planned: Natural Airway and Nasal Cannula  Additional Equipment:   Intra-op Plan:    Post-operative Plan:   Informed Consent: I have reviewed the patients History and Physical, chart, labs and discussed the procedure including the risks, benefits and alternatives for the proposed anesthesia with the patient or authorized representative who has indicated his/her understanding and acceptance.     Dental Advisory Given  Plan Discussed with: Anesthesiologist, CRNA and Surgeon  Anesthesia Plan Comments: (Patient consented for risks of anesthesia including but not limited to:  - adverse reactions to medications - damage to eyes, teeth, lips or other oral mucosa - nerve damage due to positioning  - sore throat or hoarseness - Damage to heart, brain, nerves, lungs, other parts of body or loss of life  Patient voiced understanding and assent.)         Anesthesia Quick Evaluation

## 2023-05-28 NOTE — Op Note (Addendum)
 OPERATIVE NOTE  Kenneth Chen [id number] 05/28/2023   PREOPERATIVE DIAGNOSIS: Nuclear sclerotic cataract right eye. H25.11   POSTOPERATIVE DIAGNOSIS: Nuclear sclerotic cataract right eye. H25.11   PROCEDURE:  Phacoemusification with posterior chamber intraocular lens placement of the right eye  Ultrasound time: Procedure(s): PHACOEMULSIFICATION, CATARACT, WITH IOL INSERTION 14.76, 01:28.7 (Right)  LENS:   Implant Name Type Inv. Item Serial No. Manufacturer Lot No. LRB No. Used Action  LENS CLAREON 20.5 - [id number] Intraocular Lens LENS CLAREON 20.5  [employer]  Right 1 Implanted      SURGEON:  Julious Payer. Rolley Sims, MD   ANESTHESIA:  Topical with tetracaine drops, augmented with 1% preservative-free intracameral lidocaine.   COMPLICATIONS:  None.   DESCRIPTION OF PROCEDURE:  The patient was identified in the holding room and transported to the operating room and placed in the supine position under the operating microscope.  The right eye was identified as the operative eye, which was prepped and draped in the usual sterile ophthalmic fashion.   A 1 millimeter clear-corneal paracentesis was made superotemporally. Preservative-free 1% lidocaine mixed with 1:1,000 bisulfite-free aqueous solution of epinephrine was injected into the anterior chamber. The anterior chamber was then filled with Viscoat viscoelastic. A 2.4 millimeter keratome was used to make a clear-corneal incision inferotemporally. The pupil remained significantly miotic, so a 6.25 mm Malyugin ring was inserted and engaged the pupil margin to facilitate adequate view during remainder of surgery. A curvilinear capsulorrhexis was made with a cystotome and capsulorrhexis forceps. Balanced salt solution was used to hydrodissect and hydrodelineate the nucleus. Phacoemulsification was then used to remove the lens nucleus and epinucleus. The remaining cortex was then removed using the irrigation and aspiration handpiece. Provisc was then  placed into the capsular bag to distend it for lens placement. A +20.50 D [id number] intraocular lens was then injected into the capsular bag. The Malyugin ring was removed. The remaining viscoelastic was aspirated.   Wounds were hydrated with balanced salt solution.  The anterior chamber was inflated to a physiologic pressure with balanced salt solution.  No wound leaks were noted. Moxifloxacin was injected intracamerally.  Timolol and Brimonidine drops were applied to the eye.  The patient was taken to the recovery room in stable condition without complications of anesthesia or surgery.  [employer] 05/28/2023, 10:45 AM

## 2023-05-28 NOTE — Anesthesia Postprocedure Evaluation (Signed)
 Anesthesia Post Note  Patient: Kenneth Chen  Procedure(s) Performed: PHACOEMULSIFICATION, CATARACT, WITH IOL INSERTION 14.76, 01:28.7 (Right)  Patient location during evaluation: PACU Anesthesia Type: MAC Level of consciousness: awake and alert Pain management: pain level controlled Vital Signs Assessment: post-procedure vital signs reviewed and stable Respiratory status: spontaneous breathing, nonlabored ventilation, respiratory function stable and patient connected to nasal cannula oxygen Cardiovascular status: stable and blood pressure returned to baseline Postop Assessment: no apparent nausea or vomiting Anesthetic complications: no   No notable events documented.   Last Vitals:  Vitals:   05/28/23 1047 05/28/23 1053  BP: (!) 158/78 (!) 145/81  Pulse: 91   Resp: 18   Temp: (!) 36.3 C 36.6 C  SpO2: 97%     Last Pain:  Vitals:   05/28/23 1053  TempSrc:   PainSc: 0-No pain                 Margee Trentham C Calvin Chura

## 2023-05-28 NOTE — H&P (Signed)
 [facility]   Primary Care Physician:  Kandyce Rud, MD Ophthalmologist: Dr. Deberah Pelton  Pre-Procedure History & Physical: HPI:  Kenneth Chen is a 78 y.o. male here for cataract surgery.   Past Medical History:  Diagnosis Date   Allergic rhinitis    Arthritis    Bladder stones    BPH (benign prostatic hyperplasia)    CKD stage 3 due to type 2 diabetes mellitus (HCC)    Hematuria    History of kidney stones    History of kidney stones    History of malignant melanoma of skin    s/p  excision w/ negative margins in 2016 and Apr 2017   History of migraine    Hyperlipidemia    Hypertension associated with type 2 diabetes mellitus (HCC)    Lesion of bladder    Lower urinary tract symptoms (LUTS)    OSA on CPAP    Type 2 diabetes mellitus (HCC)    Wears partial dentures    upper    Past Surgical History:  Procedure Laterality Date   CYSTOSCOPY WITH BIOPSY N/A 01/21/2016   Procedure: CYSTOSCOPY WITH BLADDER  BIOPSY;  Surgeon: Ihor Gully, MD;  Location: [facility] [place];  Service: Urology;  Laterality: N/A;   CYSTOSCOPY WITH LITHOLAPAXY N/A 01/21/2016   Procedure: CYSTOSCOPY WITH LITHOLAPAXY;  Surgeon: Ihor Gully, MD;  Location: [facility];  Service: Urology;  Laterality: N/A;   CYSTOSCOPY WITH LITHOLAPAXY N/A 08/30/2019   Procedure: CYSTOSCOPY WITH LITHOLAPAXY;  Surgeon: Noel Christmas, MD;  Location: [facility];  Service: Urology;  Laterality: N/A;   CYSTOSCOPY WITH RETROGRADE PYELOGRAM, URETEROSCOPY AND STENT PLACEMENT Left 08/30/2019   Procedure: CYSTOSCOPY WITH RETROGRADE PYELOGRAM, URETEROSCOPY AND STENT PLACEMENT;  Surgeon: Noel Christmas, MD;  Location: [facility];  Service: Urology;  Laterality: Left;   GANGLION CYST EXCISION  1972   HOLMIUM LASER APPLICATION N/A 01/21/2016   Procedure: HOLMIUM LASER APPLICATION;  Surgeon: Ihor Gully, MD;  Location: [facility];  Service: Urology;  Laterality: N/A;   HOLMIUM LASER  APPLICATION Left 08/30/2019   Procedure: HOLMIUM LASER APPLICATION;  Surgeon: Noel Christmas, MD;  Location: [facility];  Service: Urology;  Laterality: Left;   MELANOMA EXCISION  2016 and 04/ 2017   middle of back area    TOTAL KNEE ARTHROPLASTY Bilateral left 12-15-2005/  right 12-14-2006   TRANSURETHRAL RESECTION OF PROSTATE N/A 08/30/2019   Procedure: TRANSURETHRAL RESECTION OF THE PROSTATE (TURP);  Surgeon: Noel Christmas, MD;  Location: [facility];  Service: Urology;  Laterality: N/A;  3 HRS    Prior to Admission medications   Medication Sig Start Date End Date Taking? Authorizing Provider  ALLEGRA-D ALLERGY & CONGESTION 180-240 MG 24 hr tablet Take 1 tablet by mouth daily. 06/30/19   [provider]  aspirin 81 MG tablet Take 81 mg by mouth daily.    [provider]  fluticasone (FLONASE) 50 MCG/ACT nasal spray Place 1 spray into both nostrils daily as needed for allergies or rhinitis.    [provider]  glimepiride (AMARYL) 2 MG tablet Take 2 mg by mouth daily. 08/13/19   [provider]  lisinopril (ZESTRIL) 5 MG tablet Take 5 mg by mouth daily. 06/28/19   [provider]  metFORMIN (GLUCOPHAGE-XR) 500 MG 24 hr tablet Take 1,000 mg by mouth 2 (two) times daily. 05/17/19   [provider]  Multiple Vitamin (MULTIVITAMIN WITH MINERALS) TABS tablet Take 1 tablet by mouth daily.  [provider]  pioglitazone (ACTOS) 15 MG tablet Take 15 mg by mouth daily.    [provider]  rosuvastatin (CRESTOR) 5 MG tablet Take 5 mg by mouth at bedtime.    [provider]  tamsulosin (FLOMAX) 0.4 MG CAPS capsule Take 0.8 mg by mouth daily after supper.     [provider]    Allergies as of 05/12/2023   (No Known Allergies)    History reviewed. No pertinent family history.  Social History   Socioeconomic History   Marital status: Married    Spouse name: Not on file   Number of children: Not on file   Years  of education: Not on file   Highest education level: Not on file  Occupational History   Not on file  Tobacco Use   Smoking status: Light Smoker    Types: Cigars   Smokeless tobacco: Never   Tobacco comments:    occasional cigar  Vaping Use   Vaping status: Never Used  Substance and Sexual Activity   Alcohol use: No   Drug use: No   Sexual activity: Not on file  Other Topics Concern   Not on file  Social History Narrative   Not on file   Social Drivers of Health   Financial Resource Strain: Low Risk  (05/15/2023)   Received from [facility]   Overall Financial Resource Strain (CARDIA)    Difficulty of Paying Living Expenses: Not hard at all  Food Insecurity: No Food Insecurity (05/15/2023)   Received from [facility]   Hunger Vital Sign    Worried About Running Out of Food in the Last Year: Never true    Ran Out of Food in the Last Year: Never true  Transportation Needs: No Transportation Needs (05/15/2023)   Received from [facility] - Transportation    In the past 12 months, has lack of transportation kept you from medical appointments or from getting medications?: No    Lack of Transportation (Non-Medical): No  Physical Activity: Not on file  Stress: Not on file  Social Connections: Not on file  Intimate Partner Violence: Not on file    Review of Systems: See HPI, otherwise negative ROS  Physical Exam: Ht 5\' 10"  (1.778 m)   Wt 112.5 kg   BMI 35.58 kg/m  General:   Alert, cooperative in NAD Head:  Normocephalic and atraumatic. Respiratory:  Normal work of breathing. Cardiovascular:  RRR  Impression/Plan: Kenneth Chen is here for cataract surgery.  Risks, benefits, limitations, and alternatives regarding cataract surgery have been reviewed with the patient.  Questions have been answered.  All parties agreeable.   Estanislado Pandy, MD  05/28/2023, 7:05 AM

## 2023-05-29 ENCOUNTER — Encounter: Payer: Self-pay | Admitting: Ophthalmology
